# Patient Record
Sex: Female | Born: 1985 | Race: Black or African American | Hispanic: No | Marital: Single
Health system: Southern US, Community
[De-identification: ages and names within clinical notes are randomized; demographics above are authoritative.]

## PROBLEM LIST (undated history)

## (undated) ENCOUNTER — Inpatient Hospital Stay (HOSPITAL_COMMUNITY): Payer: Self-pay

## (undated) DIAGNOSIS — B9689 Other specified bacterial agents as the cause of diseases classified elsewhere: Secondary | ICD-10-CM

## (undated) DIAGNOSIS — Z789 Other specified health status: Secondary | ICD-10-CM

## (undated) DIAGNOSIS — IMO0002 Reserved for concepts with insufficient information to code with codable children: Secondary | ICD-10-CM

## (undated) DIAGNOSIS — R87619 Unspecified abnormal cytological findings in specimens from cervix uteri: Secondary | ICD-10-CM

## (undated) DIAGNOSIS — N760 Acute vaginitis: Secondary | ICD-10-CM

## (undated) HISTORY — PX: CERVIX LESION DESTRUCTION: SHX591

## (undated) HISTORY — PX: WISDOM TOOTH EXTRACTION: SHX21

---
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
## 2006-01-25 ENCOUNTER — Emergency Department (HOSPITAL_COMMUNITY): Admission: EM | Admit: 2006-01-25 | Discharge: 2006-01-25 | Payer: Self-pay | Admitting: Emergency Medicine

## 2006-08-22 ENCOUNTER — Emergency Department (HOSPITAL_COMMUNITY): Admission: EM | Admit: 2006-08-22 | Discharge: 2006-08-22 | Payer: Self-pay | Admitting: Emergency Medicine

## 2006-10-26 ENCOUNTER — Inpatient Hospital Stay (HOSPITAL_COMMUNITY): Admission: AD | Admit: 2006-10-26 | Discharge: 2006-10-27 | Payer: Self-pay | Admitting: Gynecology

## 2007-01-04 ENCOUNTER — Inpatient Hospital Stay (HOSPITAL_COMMUNITY): Admission: AD | Admit: 2007-01-04 | Discharge: 2007-01-04 | Payer: Self-pay | Admitting: Obstetrics

## 2007-04-30 ENCOUNTER — Inpatient Hospital Stay (HOSPITAL_COMMUNITY): Admission: AD | Admit: 2007-04-30 | Discharge: 2007-04-30 | Payer: Self-pay | Admitting: Obstetrics

## 2007-05-18 ENCOUNTER — Inpatient Hospital Stay (HOSPITAL_COMMUNITY): Admission: AD | Admit: 2007-05-18 | Discharge: 2007-05-18 | Payer: Self-pay | Admitting: Obstetrics

## 2007-06-02 ENCOUNTER — Inpatient Hospital Stay (HOSPITAL_COMMUNITY): Admission: AD | Admit: 2007-06-02 | Discharge: 2007-06-02 | Payer: Self-pay | Admitting: Obstetrics

## 2007-06-03 ENCOUNTER — Inpatient Hospital Stay (HOSPITAL_COMMUNITY): Admission: AD | Admit: 2007-06-03 | Discharge: 2007-06-05 | Payer: Self-pay | Admitting: Obstetrics

## 2007-06-16 ENCOUNTER — Inpatient Hospital Stay (HOSPITAL_COMMUNITY): Admission: AD | Admit: 2007-06-16 | Discharge: 2007-06-16 | Payer: Self-pay | Admitting: Obstetrics

## 2007-11-13 ENCOUNTER — Emergency Department (HOSPITAL_COMMUNITY): Admission: EM | Admit: 2007-11-13 | Discharge: 2007-11-14 | Payer: Self-pay | Admitting: Emergency Medicine

## 2008-03-28 ENCOUNTER — Emergency Department (HOSPITAL_COMMUNITY): Admission: EM | Admit: 2008-03-28 | Discharge: 2008-03-28 | Payer: Self-pay | Admitting: Emergency Medicine

## 2008-10-26 ENCOUNTER — Emergency Department (HOSPITAL_COMMUNITY): Admission: EM | Admit: 2008-10-26 | Discharge: 2008-10-26 | Payer: Self-pay | Admitting: Emergency Medicine

## 2009-10-02 ENCOUNTER — Emergency Department (HOSPITAL_COMMUNITY): Admission: EM | Admit: 2009-10-02 | Discharge: 2009-10-02 | Payer: Self-pay | Admitting: Emergency Medicine

## 2010-07-23 ENCOUNTER — Emergency Department (HOSPITAL_COMMUNITY): Admission: EM | Admit: 2010-07-23 | Discharge: 2010-07-23 | Payer: Self-pay | Admitting: Emergency Medicine

## 2010-09-09 ENCOUNTER — Emergency Department (HOSPITAL_COMMUNITY): Admission: EM | Admit: 2010-09-09 | Discharge: 2010-09-09 | Payer: Self-pay | Admitting: Emergency Medicine

## 2011-01-22 ENCOUNTER — Inpatient Hospital Stay (INDEPENDENT_AMBULATORY_CARE_PROVIDER_SITE_OTHER)
Admission: RE | Admit: 2011-01-22 | Discharge: 2011-01-22 | Disposition: A | Discharge status: Home / Self Care | Payer: Medicaid Other | Source: Ambulatory Visit | Attending: Family Medicine | Admitting: Family Medicine

## 2011-01-22 DIAGNOSIS — K5289 Other specified noninfective gastroenteritis and colitis: Secondary | ICD-10-CM

## 2011-01-27 LAB — POCT I-STAT, CHEM 8
BUN: 9 mg/dL (ref 6–23)
Calcium, Ion: 1.2 mmol/L (ref 1.12–1.32)
Chloride: 106 mEq/L (ref 96–112)
Creatinine, Ser: 0.7 mg/dL (ref 0.4–1.2)
Glucose, Bld: 94 mg/dL (ref 70–99)
HCT: 40 % (ref 36.0–46.0)
Hemoglobin: 13.6 g/dL (ref 12.0–15.0)
Potassium: 3.3 mEq/L — ABNORMAL LOW (ref 3.5–5.1)
Sodium: 145 mEq/L (ref 135–145)
TCO2: 26 mmol/L (ref 0–100)

## 2011-01-28 LAB — POCT URINALYSIS DIPSTICK
Glucose, UA: 100 mg/dL — AB
Ketones, ur: 40 mg/dL — AB
Nitrite: NEGATIVE
Protein, ur: 100 mg/dL — AB
Specific Gravity, Urine: >=1.03 (ref 1.005–1.030)
Urobilinogen, UA: 1 mg/dL (ref 0.0–1.0)
pH: 5.5 (ref 5.0–8.0)

## 2011-01-28 LAB — GC/CHLAMYDIA PROBE AMP, GENITAL
Chlamydia, DNA Probe: NEGATIVE
GC Probe Amp, Genital: NEGATIVE

## 2011-01-28 LAB — POCT PREGNANCY, URINE: Preg Test, Ur: NEGATIVE

## 2011-01-28 LAB — WET PREP, GENITAL
Clue Cells Wet Prep HPF POC: NONE SEEN
Yeast Wet Prep HPF POC: NONE SEEN

## 2011-02-17 LAB — GC/CHLAMYDIA PROBE AMP, GENITAL: Chlamydia, DNA Probe: NEGATIVE

## 2011-02-17 LAB — POCT URINALYSIS DIP (DEVICE)
Bilirubin Urine: NEGATIVE
Nitrite: NEGATIVE
Protein, ur: >=300 mg/dL — AB
Urobilinogen, UA: 0.2 mg/dL (ref 0.0–1.0)
pH: 5.5 (ref 5.0–8.0)

## 2011-02-17 LAB — WET PREP, GENITAL

## 2011-06-29 ENCOUNTER — Encounter (INDEPENDENT_AMBULATORY_CARE_PROVIDER_SITE_OTHER): Payer: Self-pay | Admitting: Surgery

## 2011-07-01 ENCOUNTER — Ambulatory Visit (INDEPENDENT_AMBULATORY_CARE_PROVIDER_SITE_OTHER): Payer: Medicaid Other | Admitting: Surgery

## 2011-07-02 ENCOUNTER — Emergency Department (HOSPITAL_COMMUNITY)
Admission: EM | Admit: 2011-07-02 | Discharge: 2011-07-02 | Disposition: A | Discharge status: Home / Self Care | Payer: Medicaid Other | Attending: Emergency Medicine | Admitting: Emergency Medicine

## 2011-07-02 DIAGNOSIS — M545 Low back pain, unspecified: Secondary | ICD-10-CM | POA: Insufficient documentation

## 2011-07-02 DIAGNOSIS — M2560 Stiffness of unspecified joint, not elsewhere classified: Secondary | ICD-10-CM | POA: Insufficient documentation

## 2011-07-02 DIAGNOSIS — S335XXA Sprain of ligaments of lumbar spine, initial encounter: Secondary | ICD-10-CM | POA: Insufficient documentation

## 2011-07-23 ENCOUNTER — Inpatient Hospital Stay (HOSPITAL_COMMUNITY)
Admission: AD | Admit: 2011-07-23 | Discharge: 2011-07-23 | Disposition: A | Discharge status: Home / Self Care | Payer: Medicaid Other | Source: Ambulatory Visit | Attending: Family Medicine | Admitting: Family Medicine

## 2011-07-23 DIAGNOSIS — T192XXA Foreign body in vulva and vagina, initial encounter: Secondary | ICD-10-CM | POA: Insufficient documentation
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
## 2011-07-26 ENCOUNTER — Telehealth (INDEPENDENT_AMBULATORY_CARE_PROVIDER_SITE_OTHER): Payer: Self-pay | Admitting: Surgery

## 2011-07-26 NOTE — Telephone Encounter (Signed)
Dr Ezzard Standing is not here at that time.  He has not seen the pt before so she can be rescheduled to see another md

## 2011-07-29 ENCOUNTER — Inpatient Hospital Stay (INDEPENDENT_AMBULATORY_CARE_PROVIDER_SITE_OTHER)
Admission: RE | Admit: 2011-07-29 | Discharge: 2011-07-29 | Disposition: A | Discharge status: Home / Self Care | Payer: No Typology Code available for payment source | Source: Ambulatory Visit | Attending: Family Medicine | Admitting: Family Medicine

## 2011-07-29 DIAGNOSIS — A64 Unspecified sexually transmitted disease: Secondary | ICD-10-CM

## 2011-07-29 LAB — POCT URINALYSIS DIP (DEVICE)
Glucose, UA: NEGATIVE mg/dL
Ketones, ur: NEGATIVE mg/dL
Leukocytes, UA: NEGATIVE
Protein, ur: NEGATIVE mg/dL

## 2011-07-29 LAB — POCT PREGNANCY, URINE: Preg Test, Ur: NEGATIVE

## 2011-07-29 LAB — WET PREP, GENITAL: Yeast Wet Prep HPF POC: NONE SEEN

## 2011-07-30 LAB — GC/CHLAMYDIA PROBE AMP, GENITAL
Chlamydia, DNA Probe: NEGATIVE
GC Probe Amp, Genital: NEGATIVE

## 2011-08-04 ENCOUNTER — Ambulatory Visit (INDEPENDENT_AMBULATORY_CARE_PROVIDER_SITE_OTHER): Payer: Medicaid Other | Admitting: Surgery

## 2011-08-20 LAB — POCT URINALYSIS DIP (DEVICE)
Bilirubin Urine: NEGATIVE
Glucose, UA: NEGATIVE mg/dL
Ketones, ur: NEGATIVE mg/dL
Nitrite: NEGATIVE

## 2011-08-20 LAB — STREP A DNA PROBE: Group A Strep Probe: NEGATIVE

## 2011-08-20 LAB — WET PREP, GENITAL: Trich, Wet Prep: NONE SEEN

## 2011-08-30 LAB — CBC
Hemoglobin: 13
Hemoglobin: 11.1 — ABNORMAL LOW
RBC: 3.85 — ABNORMAL LOW
RBC: 3.24 — ABNORMAL LOW
RBC: 3.5 — ABNORMAL LOW
RDW: 12.4
WBC: 9.4

## 2011-08-30 LAB — WET PREP, GENITAL
Clue Cells Wet Prep HPF POC: NONE SEEN
Trich, Wet Prep: NONE SEEN

## 2011-08-30 LAB — RPR: RPR Ser Ql: NONREACTIVE

## 2011-08-30 LAB — RAPID HIV SCREEN (WH-MAU): Rapid HIV Screen: NONREACTIVE

## 2011-09-01 LAB — URINALYSIS, ROUTINE W REFLEX MICROSCOPIC
Hgb urine dipstick: NEGATIVE
Specific Gravity, Urine: 1.02
Urobilinogen, UA: 0.2

## 2011-09-17 ENCOUNTER — Ambulatory Visit (INDEPENDENT_AMBULATORY_CARE_PROVIDER_SITE_OTHER): Payer: Medicaid Other | Admitting: Surgery

## 2011-10-02 ENCOUNTER — Emergency Department (HOSPITAL_COMMUNITY)
Admission: EM | Admit: 2011-10-02 | Discharge: 2011-10-02 | Disposition: A | Discharge status: Home / Self Care | Payer: Medicaid Other | Attending: Emergency Medicine | Admitting: Emergency Medicine

## 2011-10-02 ENCOUNTER — Emergency Department (HOSPITAL_COMMUNITY): Payer: Medicaid Other

## 2011-10-02 ENCOUNTER — Encounter (HOSPITAL_COMMUNITY): Payer: Self-pay | Admitting: *Deleted

## 2011-10-02 DIAGNOSIS — M25562 Pain in left knee: Secondary | ICD-10-CM

## 2011-10-02 DIAGNOSIS — M25439 Effusion, unspecified wrist: Secondary | ICD-10-CM | POA: Insufficient documentation

## 2011-10-02 DIAGNOSIS — M25532 Pain in left wrist: Secondary | ICD-10-CM

## 2011-10-02 DIAGNOSIS — M25539 Pain in unspecified wrist: Secondary | ICD-10-CM | POA: Insufficient documentation

## 2011-10-02 DIAGNOSIS — R51 Headache: Secondary | ICD-10-CM | POA: Insufficient documentation

## 2011-10-02 DIAGNOSIS — M79609 Pain in unspecified limb: Secondary | ICD-10-CM | POA: Insufficient documentation

## 2011-10-02 DIAGNOSIS — R21 Rash and other nonspecific skin eruption: Secondary | ICD-10-CM | POA: Insufficient documentation

## 2011-10-02 DIAGNOSIS — M25569 Pain in unspecified knee: Secondary | ICD-10-CM | POA: Insufficient documentation

## 2011-10-02 MED ORDER — DIAZEPAM 5 MG PO TABS: 5.0000 mg | ORAL_TABLET | Freq: Two times a day (BID) | ORAL | Status: AC

## 2011-10-02 MED ORDER — IBUPROFEN 800 MG PO TABS
800.0000 mg | ORAL_TABLET | Freq: Once | ORAL | Status: AC
  Administered 2011-10-02: 800 mg via ORAL
  Filled 2011-10-02: qty 1

## 2011-10-02 MED ORDER — IBUPROFEN 800 MG PO TABS: 800.0000 mg | ORAL_TABLET | Freq: Three times a day (TID) | ORAL | Status: AC
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
## 2011-10-02 NOTE — ED Notes (Signed)
Pt showing no sign of neuro deficits

## 2011-10-04 NOTE — ED Provider Notes (Signed)
Medical screening examination/treatment/procedure(s) were performed by non-physician practitioner and as supervising physician I was immediately available for consultation/collaboration.  Kacy Hegna L Filemon Breton, MD 10/04/11 1333 

## 2012-08-06 ENCOUNTER — Encounter (HOSPITAL_COMMUNITY): Payer: Self-pay | Admitting: *Deleted

## 2012-08-06 DIAGNOSIS — M545 Low back pain, unspecified: Secondary | ICD-10-CM | POA: Insufficient documentation
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
## 2012-08-07 ENCOUNTER — Emergency Department (HOSPITAL_COMMUNITY)
Admission: EM | Admit: 2012-08-07 | Discharge: 2012-08-07 | Disposition: A | Discharge status: Home / Self Care | Payer: Medicaid Other | Attending: Emergency Medicine | Admitting: Emergency Medicine

## 2012-08-07 DIAGNOSIS — M545 Low back pain: Secondary | ICD-10-CM

## 2012-08-07 MED ORDER — CYCLOBENZAPRINE HCL 10 MG PO TABS: 5.0000 mg | ORAL_TABLET | Freq: Two times a day (BID) | ORAL | Status: DC | PRN

## 2012-08-07 MED ORDER — IBUPROFEN 600 MG PO TABS: 600.0000 mg | ORAL_TABLET | Freq: Four times a day (QID) | ORAL | Status: DC | PRN

## 2012-08-07 NOTE — ED Provider Notes (Signed)
Medical screening examination/treatment/procedure(s) were performed by non-physician practitioner and as supervising physician I was immediately available for consultation/collaboration.  Hasson Gaspard M Javelle Donigan, MD 08/07/12 0218 
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
## 2012-10-02 ENCOUNTER — Inpatient Hospital Stay (HOSPITAL_COMMUNITY)
Admission: AD | Admit: 2012-10-02 | Discharge: 2012-10-02 | Disposition: A | Discharge status: Hospice | Payer: Medicaid Other | Source: Ambulatory Visit | Attending: Obstetrics & Gynecology | Admitting: Obstetrics & Gynecology

## 2012-10-02 ENCOUNTER — Encounter (HOSPITAL_COMMUNITY): Payer: Self-pay | Admitting: *Deleted

## 2012-10-02 DIAGNOSIS — N9489 Other specified conditions associated with female genital organs and menstrual cycle: Secondary | ICD-10-CM | POA: Insufficient documentation

## 2012-10-02 DIAGNOSIS — N949 Unspecified condition associated with female genital organs and menstrual cycle: Secondary | ICD-10-CM | POA: Insufficient documentation

## 2012-10-02 DIAGNOSIS — N898 Other specified noninflammatory disorders of vagina: Secondary | ICD-10-CM

## 2012-10-02 HISTORY — DX: Other specified health status: Z78.9

## 2012-10-02 HISTORY — DX: Reserved for concepts with insufficient information to code with codable children: IMO0002

## 2012-10-02 HISTORY — DX: Unspecified abnormal cytological findings in specimens from cervix uteri: R87.619

## 2012-10-02 LAB — URINALYSIS, ROUTINE W REFLEX MICROSCOPIC
Bilirubin Urine: NEGATIVE
Glucose, UA: NEGATIVE mg/dL
Ketones, ur: NEGATIVE mg/dL
Protein, ur: NEGATIVE mg/dL

## 2012-10-02 LAB — WET PREP, GENITAL
Trich, Wet Prep: NONE SEEN
Yeast Wet Prep HPF POC: NONE SEEN

## 2012-10-02 LAB — POCT PREGNANCY, URINE: Preg Test, Ur: NEGATIVE

## 2012-10-02 LAB — URINE MICROSCOPIC-ADD ON

## 2012-10-02 NOTE — MAU Provider Note (Signed)
History     CSN: [id number]  Arrival date and time: 10/02/12 2155   First Provider Initiated Contact with Patient 10/02/12 2225      Chief Complaint  Patient presents with  . Vaginal Pain   HPI Megan Nolan is a 26 y.o. female who presents to MAU with vaginal pain.  The pain started yesterday.  She describes the pain as sharp that comes and goes. When the pain comes it is 7/10. Current sex partner x 3 years. She is concerned that he may have given her an STI. There is vaginal discharge. There is a tender area near the urethra. Last pap smear less than one year ago at [facility] and was normal. Hx of Chlamydia years ago. The history was provided by the patient.   OB History    Grav Para Term Preterm Abortions TAB SAB Ect Mult Living   1 1 1       1       Past Medical History  Diagnosis Date  . No pertinent past medical history   . Abnormal Pap smear     Past Surgical History  Procedure Date  . Cervix lesion destruction   . Wisdom tooth extraction     Family History  Problem Relation Age of Onset  . Cancer Maternal Grandmother     History  Substance Use Topics  . Smoking status: Former Smoker    Types: Cigarettes    Quit date: 04/01/2012  . Smokeless tobacco: Not on file  . Alcohol Use: Yes     Comment: occasionally    Allergies: No Known Allergies  Prescriptions prior to admission  Medication Sig Dispense Refill  . Multiple Vitamins-Minerals (MULTIVITAMINS THER. W/MINERALS) TABS Take 1 tablet by mouth daily.        . cyclobenzaprine (FLEXERIL) 10 MG tablet Take 0.5 tablets (5 mg total) by mouth 2 (two) times daily as needed for muscle spasms.  20 tablet  0  . ibuprofen (ADVIL,MOTRIN) 600 MG tablet Take 1 tablet (600 mg total) by mouth every 6 (six) hours as needed for pain.  30 tablet  0    Review of Systems  Constitutional: Negative for fever and chills.  Eyes: Negative for blurred vision and double vision.  Respiratory: Negative for cough and  wheezing.   Cardiovascular: Negative for chest pain.  Gastrointestinal: Negative for heartburn, nausea, vomiting and abdominal pain.  Genitourinary: Negative for dysuria and frequency.       Vaginal discharge, vaginal pain  Musculoskeletal: Negative for back pain.  Neurological: Negative for dizziness, seizures and headaches.  Psychiatric/Behavioral: Negative for depression. The patient is not nervous/anxious.    Physical Exam   Blood pressure 136/81, pulse 78, temperature 97.9 F (36.6 C), temperature source Oral, resp. rate 18, height 5\' 6"  (1.676 m), weight 150 lb (68.04 kg), last menstrual period 09/13/2012, SpO2 100.00%.  Physical Exam  Nursing note and vitals reviewed. Constitutional: She is oriented to person, place, and time. She appears well-developed and well-nourished. No distress.  HENT:  Head: Normocephalic and atraumatic.  Eyes: EOM are normal.  Neck: Neck supple.  Cardiovascular: Normal rate.   Respiratory: Effort normal.  GI: Soft. There is no tenderness.  Genitourinary:       External genitalia with red raised tender area near the urethra. White creamy discharge vaginal vault. Mild CMT, no adnexal tenderness. Uterus without palpable enlargement.  Musculoskeletal: Normal range of motion.  Neurological: She is alert and oriented to person, place, and time.  Skin:  Skin is warm and dry.  Psychiatric: She has a normal mood and affect. Her behavior is normal. Judgment and thought content normal.   Results for orders placed during the hospital encounter of 10/02/12 (from the past 24 hour(s))  URINALYSIS, ROUTINE W REFLEX MICROSCOPIC     Status: Abnormal   Collection Time   10/02/12 10:05 PM      Component Value Range   Color, Urine YELLOW  YELLOW   APPearance CLEAR  CLEAR   Specific Gravity, Urine 1.025  1.005 - 1.030   pH 6.0  5.0 - 8.0   Glucose, UA NEGATIVE  NEGATIVE mg/dL   Hgb urine dipstick TRACE (*) NEGATIVE   Bilirubin Urine NEGATIVE  NEGATIVE   Ketones,  ur NEGATIVE  NEGATIVE mg/dL   Protein, ur NEGATIVE  NEGATIVE mg/dL   Urobilinogen, UA 1.0  0.0 - 1.0 mg/dL   Nitrite NEGATIVE  NEGATIVE   Leukocytes, UA NEGATIVE  NEGATIVE  URINE MICROSCOPIC-ADD ON     Status: Normal   Collection Time   10/02/12 10:05 PM      Component Value Range   Squamous Epithelial / LPF RARE  RARE   WBC, UA 0-2  <3 WBC/hpf   Bacteria, UA RARE  RARE  POCT PREGNANCY, URINE     Status: Normal   Collection Time   10/02/12 10:18 PM      Component Value Range   Preg Test, Ur NEGATIVE  NEGATIVE  WET PREP, GENITAL     Status: Abnormal   Collection Time   10/02/12 10:30 PM      Component Value Range   Yeast Wet Prep HPF POC NONE SEEN  NONE SEEN   Trich, Wet Prep NONE SEEN  NONE SEEN   Clue Cells Wet Prep HPF POC FEW (*) NONE SEEN   WBC, Wet Prep HPF POC FEW (*) NONE SEEN   MAU Course  Procedures Assessment: 26 y.o. female with vaginal pain   Genital lesion   Vaginal discharge  Plan:  STI screening GC, Chlamydia, HSV   Discussed with patient   Ibuprofen as needed for pain.   Follow up with [facility].  Discussed with the patient and all questioned fully answered. She will return if any problems arise.   Medication List     As of 10/03/2012  7:22 AM    CONTINUE taking these medications         cyclobenzaprine 10 MG tablet   Commonly known as: FLEXERIL   Take 0.5 tablets (5 mg total) by mouth 2 (two) times daily as needed for muscle spasms.      ibuprofen 600 MG tablet   Commonly known as: ADVIL,MOTRIN   Take 1 tablet (600 mg total) by mouth every 6 (six) hours as needed for pain.      multivitamins ther. w/minerals Tabs            Megan Nolan 10/02/2012, 10:57 PM

## 2012-10-02 NOTE — MAU Note (Signed)
Tonight she noticed a bump on her left labia, no burning when she voids, and no birth control either

## 2012-10-02 NOTE — MAU Note (Signed)
Pt reports "i am having shocking pains in my vagina", states she has a rash and itching. LMP 09/13/2012

## 2012-10-04 LAB — HERPES SIMPLEX VIRUS CULTURE

## 2012-10-04 NOTE — MAU Provider Note (Signed)
Attestation of Attending Supervision of Advanced Practitioner (CNM/NP): Evaluation and management procedures were performed by the Advanced Practitioner under my supervision and collaboration.  I have reviewed the Advanced Practitioner's note and chart, and I agree with the management and plan.  HARRAWAY-SMITH, Emmer Lillibridge 12:17 PM     

## 2013-02-20 ENCOUNTER — Encounter (HOSPITAL_COMMUNITY): Payer: Self-pay | Admitting: *Deleted

## 2013-02-20 ENCOUNTER — Emergency Department (HOSPITAL_COMMUNITY)
Admission: EM | Admit: 2013-02-20 | Discharge: 2013-02-20 | Disposition: A | Discharge status: Home / Self Care | Payer: Medicaid Other | Attending: Emergency Medicine | Admitting: Emergency Medicine

## 2013-02-20 DIAGNOSIS — R109 Unspecified abdominal pain: Secondary | ICD-10-CM | POA: Insufficient documentation

## 2013-02-20 DIAGNOSIS — N390 Urinary tract infection, site not specified: Secondary | ICD-10-CM | POA: Insufficient documentation

## 2013-02-20 DIAGNOSIS — R3915 Urgency of urination: Secondary | ICD-10-CM | POA: Insufficient documentation

## 2013-02-20 DIAGNOSIS — R350 Frequency of micturition: Secondary | ICD-10-CM | POA: Insufficient documentation

## 2013-02-20 DIAGNOSIS — Z3202 Encounter for pregnancy test, result negative: Secondary | ICD-10-CM | POA: Insufficient documentation

## 2013-02-20 DIAGNOSIS — Z87891 Personal history of nicotine dependence: Secondary | ICD-10-CM | POA: Insufficient documentation

## 2013-02-20 DIAGNOSIS — Z8742 Personal history of other diseases of the female genital tract: Secondary | ICD-10-CM | POA: Insufficient documentation

## 2013-02-20 LAB — URINALYSIS, ROUTINE W REFLEX MICROSCOPIC
Glucose, UA: NEGATIVE mg/dL
Protein, ur: 100 mg/dL — AB
Specific Gravity, Urine: 1.024 (ref 1.005–1.030)

## 2013-02-20 LAB — URINE MICROSCOPIC-ADD ON

## 2013-02-20 LAB — POCT PREGNANCY, URINE: Preg Test, Ur: NEGATIVE

## 2013-02-20 MED ORDER — NITROFURANTOIN MONOHYD MACRO 100 MG PO CAPS
100.0000 mg | ORAL_CAPSULE | Freq: Once | ORAL | Status: AC
  Administered 2013-02-20: 100 mg via ORAL
  Filled 2013-02-20: qty 1

## 2013-02-20 MED ORDER — NITROFURANTOIN MONOHYD MACRO 100 MG PO CAPS: 100.0000 mg | ORAL_CAPSULE | Freq: Two times a day (BID) | ORAL | Status: DC

## 2013-02-20 MED ORDER — PHENAZOPYRIDINE HCL 200 MG PO TABS: 200.0000 mg | ORAL_TABLET | Freq: Three times a day (TID) | ORAL | Status: DC | PRN

## 2013-02-20 MED ORDER — PHENAZOPYRIDINE HCL 100 MG PO TABS
200.0000 mg | ORAL_TABLET | Freq: Once | ORAL | Status: AC
  Administered 2013-02-20: 200 mg via ORAL
  Filled 2013-02-20: qty 2

## 2013-02-20 NOTE — ED Notes (Signed)
Pt c/o pain with urination and urinary frequency x 2 days, also concerned for STD.  Denies n/v, fever/chills.

## 2013-02-20 NOTE — ED Provider Notes (Signed)
History     CSN: [id number]  Arrival date & time 02/20/13  0130   First MD Initiated Contact with Patient 02/20/13 0405      Chief Complaint  Patient presents with  . Dysuria   HPI  History provided by the patient. Patient is a 27 year old female with no significant PMH who presents with complaints of dysuria, urinary frequency for the past 2 days. Symptoms have progressively worsened. They're described as a burning sensation with urination. She also has urgency and frequency. Symptoms have not been associated with any other symptoms. No fever, chills or sweats. No nausea or vomiting. No flank pain. No visual change. No vaginal bleeding or discharge. Patient is sexually active the same partner for the past 2 years. She does report history of STD many years ago as a teenager. She has not had similar symptoms of vaginal discharge previously. No other aggravating or alleviating factors. No other associated symptoms.    Past Medical History  Diagnosis Date  . No pertinent past medical history   . Abnormal Pap smear     Past Surgical History  Procedure Laterality Date  . Cervix lesion destruction    . Wisdom tooth extraction      Family History  Problem Relation Age of Onset  . Cancer Maternal Grandmother     History  Substance Use Topics  . Smoking status: Former Smoker    Types: Cigarettes    Quit date: 04/01/2012  . Smokeless tobacco: Not on file  . Alcohol Use: Yes     Comment: occasionally    OB History   Grav Para Term Preterm Abortions TAB SAB Ect Mult Living   1 1 1       1       Review of Systems  Constitutional: Negative for fever and chills.  Genitourinary: Positive for dysuria and frequency. Negative for hematuria and menstrual problem.  All other systems reviewed and are negative.    Allergies  Review of patient's allergies indicates no known allergies.  Home Medications  No current outpatient prescriptions on file.  BP 124/74  Pulse 70   Temp(Src) 97.8 F (36.6 C) (Oral)  Resp 18  SpO2 94%  LMP 02/05/2013  Physical Exam  Nursing note and vitals reviewed. Constitutional: She is oriented to person, place, and time. She appears well-developed and well-nourished. No distress.  HENT:  Head: Normocephalic.  Cardiovascular: Normal rate and regular rhythm.   Pulmonary/Chest: Effort normal and breath sounds normal.  Abdominal: Soft. There is tenderness in the suprapubic area. There is no rebound, no guarding, no CVA tenderness, no tenderness at McBurney's point and negative Murphy's sign.  Musculoskeletal: Normal range of motion.  Neurological: She is alert and oriented to person, place, and time.  Skin: Skin is warm and dry. No rash noted.  Psychiatric: She has a normal mood and affect. Her behavior is normal.    ED Course  Procedures   Results for orders placed during the hospital encounter of 02/20/13  URINALYSIS, ROUTINE W REFLEX MICROSCOPIC      Result Value Range   Color, Urine YELLOW  YELLOW   APPearance TURBID (*) CLEAR   Specific Gravity, Urine 1.024  1.005 - 1.030   pH 6.0  5.0 - 8.0   Glucose, UA NEGATIVE  NEGATIVE mg/dL   Hgb urine dipstick MODERATE (*) NEGATIVE   Bilirubin Urine NEGATIVE  NEGATIVE   Ketones, ur NEGATIVE  NEGATIVE mg/dL   Protein, ur 409 (*) NEGATIVE mg/dL   Urobilinogen,  UA 1.0  0.0 - 1.0 mg/dL   Nitrite POSITIVE (*) NEGATIVE   Leukocytes, UA LARGE (*) NEGATIVE  URINE MICROSCOPIC-ADD ON      Result Value Range   Squamous Epithelial / LPF FEW (*) RARE   WBC, UA TOO NUMEROUS TO COUNT  <3 WBC/hpf   RBC / HPF 3-6  <3 RBC/hpf   Bacteria, UA MANY (*) RARE  POCT PREGNANCY, URINE      Result Value Range   Preg Test, Ur NEGATIVE  NEGATIVE        1. UTI (lower urinary tract infection)       MDM  4:25AM patient seen and evaluated. Patient well-appearing in no acute distress. She does not appear severely ill or toxic.  Patient denies having any vaginal bleeding or discharge. We  discussed options for pelvic examination screened for STDs however patient no longer wishes to wait to have this performed. She prefers to try treatment for urinary tract infection to see if symptoms improve. She has been [facility] if she wishes to have further testing. She is also advised may return at anytime if she wishes to have further evaluations.      Angus Seller, PA-C 02/20/13 2203

## 2013-02-21 LAB — URINE CULTURE: Colony Count: >=100000

## 2013-02-21 NOTE — ED Provider Notes (Signed)
Medical screening examination/treatment/procedure(s) were performed by non-physician practitioner and as supervising physician I was immediately available for consultation/collaboration.  Alaija Ruble, MD 02/21/13 0628 

## 2013-02-22 ENCOUNTER — Telehealth (HOSPITAL_COMMUNITY): Payer: Self-pay | Admitting: Emergency Medicine

## 2013-04-19 ENCOUNTER — Encounter (HOSPITAL_COMMUNITY): Payer: Self-pay

## 2013-04-19 ENCOUNTER — Inpatient Hospital Stay (HOSPITAL_COMMUNITY)
Admission: AD | Admit: 2013-04-19 | Discharge: 2013-04-19 | Disposition: A | Discharge status: Home / Self Care | Payer: Medicaid Other | Source: Ambulatory Visit | Attending: Obstetrics & Gynecology | Admitting: Obstetrics & Gynecology

## 2013-04-19 DIAGNOSIS — N760 Acute vaginitis: Secondary | ICD-10-CM

## 2013-04-19 DIAGNOSIS — B9689 Other specified bacterial agents as the cause of diseases classified elsewhere: Secondary | ICD-10-CM

## 2013-04-19 DIAGNOSIS — O2390 Unspecified genitourinary tract infection in pregnancy, unspecified trimester: Secondary | ICD-10-CM | POA: Insufficient documentation

## 2013-04-19 DIAGNOSIS — A499 Bacterial infection, unspecified: Secondary | ICD-10-CM

## 2013-04-19 DIAGNOSIS — N949 Unspecified condition associated with female genital organs and menstrual cycle: Secondary | ICD-10-CM | POA: Insufficient documentation

## 2013-04-19 LAB — URINALYSIS, ROUTINE W REFLEX MICROSCOPIC
Bilirubin Urine: NEGATIVE
Ketones, ur: NEGATIVE mg/dL
Nitrite: NEGATIVE
Specific Gravity, Urine: 1.015 (ref 1.005–1.030)
Urobilinogen, UA: 0.2 mg/dL (ref 0.0–1.0)

## 2013-04-19 LAB — URINE MICROSCOPIC-ADD ON

## 2013-04-19 LAB — POCT PREGNANCY, URINE: Preg Test, Ur: POSITIVE — AB

## 2013-04-19 LAB — WET PREP, GENITAL

## 2013-04-19 MED ORDER — METRONIDAZOLE 500 MG PO TABS: 500.0000 mg | ORAL_TABLET | Freq: Two times a day (BID) | ORAL | Status: DC

## 2013-04-19 MED ORDER — ONDANSETRON 8 MG PO TBDP
8.0000 mg | ORAL_TABLET | Freq: Once | ORAL | Status: AC
  Administered 2013-04-19: 8 mg via ORAL
  Filled 2013-04-19: qty 1

## 2013-04-19 MED ORDER — ONDANSETRON HCL 4 MG PO TABS: 4.0000 mg | ORAL_TABLET | Freq: Three times a day (TID) | ORAL | Status: DC | PRN

## 2013-04-19 NOTE — MAU Note (Signed)
Patient states she has had a positive home pregnancy test but recently had a Depo injection. Has not been feeling well, nausea and no appetite. Has a vaginal discharge with odor.

## 2013-04-19 NOTE — Discharge Instructions (Signed)
Bacterial Vaginosis Bacterial vaginosis (BV) is a vaginal infection where the normal balance of bacteria in the vagina is disrupted. The normal balance is then replaced by an overgrowth of certain bacteria. There are several different kinds of bacteria that can cause BV. BV is the most common vaginal infection in women of childbearing age. CAUSES   The cause of BV is not fully understood. BV develops when there is an increase or imbalance of harmful bacteria.  Some activities or behaviors can upset the normal balance of bacteria in the vagina and put women at increased risk including:  Having a new sex partner or multiple sex partners.  Douching.  Using an intrauterine device (IUD) for contraception.  It is not clear what role sexual activity plays in the development of BV. However, women that have never had sexual intercourse are rarely infected with BV. Women do not get BV from toilet seats, bedding, swimming pools or from touching objects around them.  SYMPTOMS   Grey vaginal discharge.  A fish-like odor with discharge, especially after sexual intercourse.  Itching or burning of the vagina and vulva.  Burning or pain with urination.  Some women have no signs or symptoms at all. DIAGNOSIS  Your caregiver must examine the vagina for signs of BV. Your caregiver will perform lab tests and look at the sample of vaginal fluid through a microscope. They will look for bacteria and abnormal cells (clue cells), a pH test higher than 4.5, and a positive amine test all associated with BV.  RISKS AND COMPLICATIONS   Pelvic inflammatory disease (PID).  Infections following gynecology surgery.  Developing HIV.  Developing herpes virus. TREATMENT  Sometimes BV will clear up without treatment. However, all women with symptoms of BV should be treated to avoid complications, especially if gynecology surgery is planned. Female partners generally do not need to be treated. However, BV may spread  between female sex partners so treatment is helpful in preventing a recurrence of BV.   BV may be treated with antibiotics. The antibiotics come in either pill or vaginal cream forms. Either can be used with nonpregnant or pregnant women, but the recommended dosages differ. These antibiotics are not harmful to the baby.  BV can recur after treatment. If this happens, a second round of antibiotics will often be prescribed.  Treatment is important for pregnant women. If not treated, BV can cause a premature delivery, especially for a pregnant woman who had a premature birth in the past. All pregnant women who have symptoms of BV should be checked and treated.  For chronic reoccurrence of BV, treatment with a type of prescribed gel vaginally twice a week is helpful. HOME CARE INSTRUCTIONS   Finish all medication as directed by your caregiver.  Do not have sex until treatment is completed.  Tell your sexual partner that you have a vaginal infection. They should see their caregiver and be treated if they have problems, such as a mild rash or itching.  Practice safe sex. Use condoms. Only have 1 sex partner. PREVENTION  Basic prevention steps can help reduce the risk of upsetting the natural balance of bacteria in the vagina and developing BV:  Do not have sexual intercourse (be abstinent).  Do not douche.  Use all of the medicine prescribed for treatment of BV, even if the signs and symptoms go away.  Tell your sex partner if you have BV. That way, they can be treated, if needed, to prevent reoccurrence. SEEK MEDICAL CARE IF:     Your symptoms are not improving after 3 days of treatment.  You have increased discharge, pain, or fever. MAKE SURE YOU:   Understand these instructions.  Will watch your condition.  Will get help right away if you are not doing well or get worse. FOR MORE INFORMATION  Division of STD Prevention (DSTDP), Centers for Disease Control and Prevention:  www.cdc.gov/std American [employer] Health Association (ASHA): www.ashastd.org  Document Released: 11/01/2005 Document Revised: 01/24/2012 Document Reviewed: 04/24/2009 Exit[employer] Patient Information 2014 [employer].  

## 2013-04-19 NOTE — MAU Provider Note (Signed)
History     CSN: [id number]  Arrival date and time: 04/19/13 1843   First Provider Initiated Contact with Patient 04/19/13 1943      Chief Complaint  Patient presents with  . Possible Pregnancy  . Nausea  . Vaginal Discharge   HPI [name] is a 27 y.o. G2P1001 at [redacted]w[redacted]d who presents to MAU for vaginal discharge, nausea and +HPT. The patient states LMP of 03/09/13. Had Depo provera at the [facility] on 03/22/13. Has not had any bleeding since. States frequent nausea without vomiting, dizziness, discharge. The discharge is thin and white with an odor. She denies fever, vaginal bleeding, abdominal pain or UTI symptoms.   OB History   Grav Para Term Preterm Abortions TAB SAB Ect Mult Living   2 1 1       1       Past Medical History  Diagnosis Date  . No pertinent past medical history   . Abnormal Pap smear     Past Surgical History  Procedure Laterality Date  . Cervix lesion destruction    . Wisdom tooth extraction      Family History  Problem Relation Age of Onset  . Cancer Maternal Grandmother     History  Substance Use Topics  . Smoking status: Former Smoker    Types: Cigarettes    Quit date: 04/01/2012  . Smokeless tobacco: Not on file  . Alcohol Use: Yes     Comment: occasionally    Allergies: No Known Allergies  Prescriptions prior to admission  Medication Sig Dispense Refill  . nitrofurantoin, macrocrystal-monohydrate, (MACROBID) 100 MG capsule Take 1 capsule (100 mg total) by mouth 2 (two) times daily. X 7 days  14 capsule  0  . phenazopyridine (PYRIDIUM) 200 MG tablet Take 1 tablet (200 mg total) by mouth 3 (three) times daily as needed for pain.  12 tablet  0    Review of Systems  Constitutional: Negative for fever and malaise/fatigue.  Gastrointestinal: Positive for nausea and constipation. Negative for vomiting, abdominal pain and diarrhea.  Genitourinary: Negative for dysuria, urgency and frequency.       Neg - vaginal bleeding + vaginal  discharge  Neurological: Positive for dizziness. Negative for loss of consciousness.   Physical Exam   Blood pressure 121/68, pulse 92, temperature 98.2 F (36.8 C), temperature source Oral, resp. rate 18, height 5' 6.5" (1.689 m), weight 151 lb 12.8 oz (68.856 kg), last menstrual period 03/09/2013.  Physical Exam  Constitutional: She is oriented to person, place, and time. She appears well-developed and well-nourished. No distress.  HENT:  Head: Normocephalic and atraumatic.  Cardiovascular: Normal rate, regular rhythm and normal heart sounds.   Respiratory: Effort normal and breath sounds normal. No respiratory distress.  GI: Soft. Bowel sounds are normal. She exhibits no distension and no mass. There is no tenderness. There is no rebound and no guarding.  Genitourinary: Uterus is enlarged (appropriate for GA). Uterus is not tender. Cervix exhibits no motion tenderness, no discharge and no friability. Right adnexum displays no mass and no tenderness. Left adnexum displays no mass and no tenderness. No bleeding around the vagina. Vaginal discharge (moderate amount of frothy, white discharge noted) found.  Neurological: She is alert and oriented to person, place, and time.  Skin: Skin is warm. No erythema.  Psychiatric: She has a normal mood and affect.   Results for orders placed during the hospital encounter of 04/19/13 (from the past 24 hour(s))  URINALYSIS, ROUTINE W REFLEX  MICROSCOPIC     Status: Abnormal   Collection Time    04/19/13  7:00 PM      Result Value Range   Color, Urine YELLOW  YELLOW   APPearance CLEAR  CLEAR   Specific Gravity, Urine 1.015  1.005 - 1.030   pH 6.0  5.0 - 8.0   Glucose, UA NEGATIVE  NEGATIVE mg/dL   Hgb urine dipstick TRACE (*) NEGATIVE   Bilirubin Urine NEGATIVE  NEGATIVE   Ketones, ur NEGATIVE  NEGATIVE mg/dL   Protein, ur NEGATIVE  NEGATIVE mg/dL   Urobilinogen, UA 0.2  0.0 - 1.0 mg/dL   Nitrite NEGATIVE  NEGATIVE   Leukocytes, UA TRACE (*)  NEGATIVE  URINE MICROSCOPIC-ADD ON     Status: None   Collection Time    04/19/13  7:00 PM      Result Value Range   Squamous Epithelial / LPF RARE  RARE   WBC, UA 0-2  <3 WBC/hpf   RBC / HPF 0-2  <3 RBC/hpf   Bacteria, UA RARE  RARE  POCT PREGNANCY, URINE     Status: Abnormal   Collection Time    04/19/13  7:05 PM      Result Value Range   Preg Test, Ur POSITIVE (*) NEGATIVE    MAU Course  Procedures None  MDM UA, UPT, Wet prep, GC/Chlamydia today ODT Zofran given in MAU   Results for orders placed during the hospital encounter of 04/19/13 (from the past 24 hour(s))  URINALYSIS, ROUTINE W REFLEX MICROSCOPIC     Status: Abnormal   Collection Time    04/19/13  7:00 PM      Result Value Range   Color, Urine YELLOW  YELLOW   APPearance CLEAR  CLEAR   Specific Gravity, Urine 1.015  1.005 - 1.030   pH 6.0  5.0 - 8.0   Glucose, UA NEGATIVE  NEGATIVE mg/dL   Hgb urine dipstick TRACE (*) NEGATIVE   Bilirubin Urine NEGATIVE  NEGATIVE   Ketones, ur NEGATIVE  NEGATIVE mg/dL   Protein, ur NEGATIVE  NEGATIVE mg/dL   Urobilinogen, UA 0.2  0.0 - 1.0 mg/dL   Nitrite NEGATIVE  NEGATIVE   Leukocytes, UA TRACE (*) NEGATIVE  URINE MICROSCOPIC-ADD ON     Status: None   Collection Time    04/19/13  7:00 PM      Result Value Range   Squamous Epithelial / LPF RARE  RARE   WBC, UA 0-2  <3 WBC/hpf   RBC / HPF 0-2  <3 RBC/hpf   Bacteria, UA RARE  RARE  POCT PREGNANCY, URINE     Status: Abnormal   Collection Time    04/19/13  7:05 PM      Result Value Range   Preg Test, Ur POSITIVE (*) NEGATIVE  WET PREP, GENITAL     Status: Abnormal   Collection Time    04/19/13  8:00 PM      Result Value Range   Yeast Wet Prep HPF POC NONE SEEN  NONE SEEN   Trich, Wet Prep NONE SEEN  NONE SEEN   Clue Cells Wet Prep HPF POC FEW (*) NONE SEEN   WBC, Wet Prep HPF POC FEW (*) NONE SEEN     Assessment and Plan  2000 - Results pending. Care turned over to [facility], CNM  Freddi Starr, PA-C   04/19/2013, 7:43 PM   1. BV (bacterial vaginosis)    RX: Flagyl 500mg  BID X 7 Zofran 4mg   PO #30  Start [facility] or plan termination as soon as possible.

## 2013-04-19 NOTE — MAU Note (Addendum)
Pt stated she had a positive home pregnancy test today but recently had Depo around the beginning of May. Pt states her LMP was near the end of April around the 25th. Pt states she recently has not been feeling well, experiencing nausea, dry heaves, and foul-smelling discharge (pt stated having intercourse sometime last wk). Pt denies any pain or vaginal bleeding

## 2013-04-20 LAB — GC/CHLAMYDIA PROBE AMP
CT Probe RNA: NEGATIVE
GC Probe RNA: NEGATIVE

## 2013-04-30 ENCOUNTER — Encounter (HOSPITAL_COMMUNITY): Payer: Self-pay | Admitting: *Deleted

## 2013-04-30 ENCOUNTER — Inpatient Hospital Stay (HOSPITAL_COMMUNITY)
Admission: AD | Admit: 2013-04-30 | Discharge: 2013-04-30 | Disposition: A | Discharge status: Home / Self Care | Payer: Medicaid Other | Source: Ambulatory Visit | Attending: Obstetrics & Gynecology | Admitting: Obstetrics & Gynecology

## 2013-04-30 DIAGNOSIS — O2390 Unspecified genitourinary tract infection in pregnancy, unspecified trimester: Secondary | ICD-10-CM | POA: Insufficient documentation

## 2013-04-30 DIAGNOSIS — N760 Acute vaginitis: Secondary | ICD-10-CM | POA: Insufficient documentation

## 2013-04-30 DIAGNOSIS — O219 Vomiting of pregnancy, unspecified: Secondary | ICD-10-CM

## 2013-04-30 DIAGNOSIS — O210 Mild hyperemesis gravidarum: Secondary | ICD-10-CM | POA: Insufficient documentation

## 2013-04-30 DIAGNOSIS — B9689 Other specified bacterial agents as the cause of diseases classified elsewhere: Secondary | ICD-10-CM | POA: Insufficient documentation

## 2013-04-30 DIAGNOSIS — A499 Bacterial infection, unspecified: Secondary | ICD-10-CM

## 2013-04-30 DIAGNOSIS — N949 Unspecified condition associated with female genital organs and menstrual cycle: Secondary | ICD-10-CM | POA: Insufficient documentation

## 2013-04-30 HISTORY — DX: Other specified bacterial agents as the cause of diseases classified elsewhere: N76.0

## 2013-04-30 HISTORY — DX: Acute vaginitis: B96.89

## 2013-04-30 LAB — URINALYSIS, ROUTINE W REFLEX MICROSCOPIC
Leukocytes, UA: NEGATIVE
Protein, ur: NEGATIVE mg/dL
Urobilinogen, UA: 0.2 mg/dL (ref 0.0–1.0)

## 2013-04-30 MED ORDER — DOXYLAMINE-PYRIDOXINE 10-10 MG PO TBEC: 2.0000 | DELAYED_RELEASE_TABLET | Freq: Every day | ORAL | Status: DC

## 2013-04-30 MED ORDER — METRONIDAZOLE 0.75 % VA GEL: 1.0000 | Freq: Two times a day (BID) | VAGINAL | Status: DC

## 2013-04-30 MED ORDER — PRENATAL COMPLETE 14-0.4 MG PO TABS: 1.0000 | ORAL_TABLET | Freq: Every day | ORAL | Status: DC

## 2013-04-30 NOTE — MAU Note (Signed)
Was being treated for BV but did not finish her rx;

## 2013-04-30 NOTE — MAU Provider Note (Signed)
Attestation of Attending Supervision of Advanced Practitioner (CNM/NP): Evaluation and management procedures were performed by the Advanced Practitioner under my supervision and collaboration.  I have reviewed the Advanced Practitioner's note and chart, and I agree with the management and plan.  HARRAWAY-SMITH, Lorelei Heikkila 6:01 PM     

## 2013-04-30 NOTE — MAU Note (Signed)
Wanting to know how far along she is since she conceived on depo.  Also was dx with BV, has been throwing up a lot and knows she has not really been treated.

## 2013-04-30 NOTE — MAU Provider Note (Signed)
History     CSN: [id number]  Arrival date and time: 04/30/13 1311   First Provider Initiated Contact with Patient 04/30/13 1432      Chief Complaint  Patient presents with  . see note, wants to know gest age    HPI [name] is a 27 y.o. G2P1001 at [redacted]w[redacted]d who presents to MAU today with complaint of N/V and vaginal discharge. The patient states that she was taking Zofran for nausea and it would work, but not for more than 1-2 hours. She was taking it more frequently than prescribed and called here to see if that was ok and states that a nurse told her not to take it anymore. The patient denies diarrhea, but is having constipation. Last BM was yesterday. She denies abdominal pain, vaginal bleeding or fever. She continues to have the same discharge that was recently evaluated here and diagnosed as BV. Patient states that Flagyl is making her throw up so she doesn't feel that she has been treated adequately. The patient is unsure if she wants to keep this pregnancy, but wants a pregnancy letter since she lost the last one given in MAU.   OB History   Grav Para Term Preterm Abortions TAB SAB Ect Mult Living   2 1 1       1       Past Medical History  Diagnosis Date  . No pertinent past medical history   . Abnormal Pap smear   . Bacterial vaginosis     Past Surgical History  Procedure Laterality Date  . Cervix lesion destruction    . Wisdom tooth extraction      Family History  Problem Relation Age of Onset  . Cancer Maternal Grandmother     History  Substance Use Topics  . Smoking status: Former Smoker    Types: Cigarettes    Quit date: 04/01/2012  . Smokeless tobacco: Not on file  . Alcohol Use: Yes     Comment: occasionally    Allergies: No Known Allergies  No prescriptions prior to admission    Review of Systems  Constitutional: Negative for fever and malaise/fatigue.  Gastrointestinal: Positive for nausea, vomiting and constipation. Negative for  abdominal pain and diarrhea.  Genitourinary: Negative for dysuria, urgency and frequency.       Neg - vaginal bleeding +discharge  Neurological: Negative for dizziness.   Physical Exam   Blood pressure 133/74, pulse 93, temperature 98.4 F (36.9 C), temperature source Oral, resp. rate 16, last menstrual period 03/09/2013.  Physical Exam  Constitutional: She is oriented to person, place, and time. She appears well-developed and well-nourished. No distress.  HENT:  Head: Normocephalic and atraumatic.  Cardiovascular: Normal rate, regular rhythm and normal heart sounds.   Respiratory: Effort normal and breath sounds normal. No respiratory distress.  GI: Soft. Bowel sounds are normal. She exhibits no distension and no mass. There is no tenderness. There is no rebound and no guarding.  Neurological: She is alert and oriented to person, place, and time.  Skin: Skin is warm and dry. No erythema.  Psychiatric: She has a normal mood and affect.   Results for orders placed during the hospital encounter of 04/30/13 (from the past 24 hour(s))  URINALYSIS, ROUTINE W REFLEX MICROSCOPIC     Status: Abnormal   Collection Time    04/30/13  2:30 PM      Result Value Range   Color, Urine YELLOW  YELLOW   APPearance HAZY (*) CLEAR  Specific Gravity, Urine 1.015  1.005 - 1.030   pH 6.5  5.0 - 8.0   Glucose, UA NEGATIVE  NEGATIVE mg/dL   Hgb urine dipstick NEGATIVE  NEGATIVE   Bilirubin Urine NEGATIVE  NEGATIVE   Ketones, ur NEGATIVE  NEGATIVE mg/dL   Protein, ur NEGATIVE  NEGATIVE mg/dL   Urobilinogen, UA 0.2  0.0 - 1.0 mg/dL   Nitrite NEGATIVE  NEGATIVE   Leukocytes, UA NEGATIVE  NEGATIVE     MAU Course  Procedures None  MDM UA today No signs of dehydration  Assessment and Plan  A: Nausea and vomiting in pregnancy prior to [redacted] weeks gestation Bacterial vaginosis  P: Discharge home Rx for Prenatal vitamins, Diclegis and metrogel Patient advised to start prenatal care with OB  provider of choice or decide about termination as soon as possible Pregnancy confirmation letter given again as patient states that she lost the previous letter Patient advised to stop using the Zofran and try OTC stool softener for constipation symptoms Patient may return to MAU as needed or if her condition were to change or worsen  Freddi Starr, PA-C  04/30/2013, 5:53 PM

## 2013-05-30 ENCOUNTER — Encounter: Payer: Self-pay | Admitting: Obstetrics

## 2013-05-30 ENCOUNTER — Encounter (HOSPITAL_COMMUNITY): Payer: Self-pay | Admitting: Gynecology

## 2013-05-30 ENCOUNTER — Ambulatory Visit (INDEPENDENT_AMBULATORY_CARE_PROVIDER_SITE_OTHER): Payer: Medicaid Other | Admitting: Obstetrics

## 2013-05-30 VITALS — BP 119/74 | Temp 98.2°F | Wt 144.8 lb

## 2013-05-30 DIAGNOSIS — Z3687 Encounter for antenatal screening for uncertain dates: Secondary | ICD-10-CM

## 2013-05-30 DIAGNOSIS — Z1389 Encounter for screening for other disorder: Secondary | ICD-10-CM

## 2013-05-30 DIAGNOSIS — N760 Acute vaginitis: Secondary | ICD-10-CM

## 2013-05-30 DIAGNOSIS — Z3480 Encounter for supervision of other normal pregnancy, unspecified trimester: Secondary | ICD-10-CM

## 2013-05-30 DIAGNOSIS — Z113 Encounter for screening for infections with a predominantly sexual mode of transmission: Secondary | ICD-10-CM

## 2013-05-30 DIAGNOSIS — Z3481 Encounter for supervision of other normal pregnancy, first trimester: Secondary | ICD-10-CM

## 2013-05-30 NOTE — Addendum Note (Signed)
Addended by: Julaine Hua on: 05/30/2013 06:13 PM   Modules accepted: Orders

## 2013-05-30 NOTE — Progress Notes (Signed)
Pulse- 86 . Subjective:    Megan Nolan is being seen today for her first obstetrical visit. (Age 27)  This is not a planned pregnancy. She is at [redacted]w[redacted]d gestation. Her obstetrical history is significant for ? pregnancy on last Depo Provera shot in May.. Relationship with FOB: significant other, not living together. Patient does intend to breast feed. Pregnancy history fully reviewed.  Menstrual History: OB History   Grav Para Term Preterm Abortions TAB SAB Ect Mult Living   2 1 1       1       Menarche age: 50 Patient's last menstrual period was 03/09/2013.    The following portions of the patient's history were reviewed and updated as appropriate: allergies, current medications, past family history, past medical history, past social history, past surgical history and problem list.  Review of Systems Pertinent items are noted in HPI.    Objective:    General appearance: alert and no distress Abdomen: normal findings: soft, non-tender Pelvic: cervix normal in appearance, external genitalia normal, no adnexal masses or tenderness, no cervical motion tenderness and vagina normal without discharge    Assessment:    Pregnancy at [redacted]w[redacted]d weeks    Plan:    Initial labs drawn. Prenatal vitamins.  Counseling provided regarding continued use of seat belts, cessation of alcohol consumption, smoking or use of illicit drugs; infection precautions i.e., influenza/TDAP immunizations, toxoplasmosis,CMV, parvovirus, listeria and varicella; workplace safety, exercise during pregnancy; routine dental care, safe medications, sexual activity, hot tubs, saunas, pools, travel, caffeine use, fish and methlymercury, potential toxins, hair treatments, varicose veins Weight gain recommendations reviewed: underweight/BMI< 18.5--> gain 28 - 40 lbs; normal weight/BMI 18.5 - 24.9--> gain 25 - 25 lbs; overweight/BMI 25 - 29.9--> gain 15 - 25 lbs; obese/BMI >30->gain  11 - 20 lbs Problem list reviewed and  updated. AFP3 discussed: requested. Role of ultrasound in pregnancy discussed; fetal survey: requested. Amniocentesis discussed: not indicated. Follow up in 2 weeks. 50% of 20 min visit spent on counseling and coordination of care.

## 2013-05-31 ENCOUNTER — Other Ambulatory Visit: Payer: Self-pay | Admitting: Obstetrics

## 2013-05-31 DIAGNOSIS — Z3687 Encounter for antenatal screening for uncertain dates: Secondary | ICD-10-CM

## 2013-05-31 LAB — GC/CHLAMYDIA PROBE AMP: CT Probe RNA: NEGATIVE

## 2013-05-31 LAB — OBSTETRIC PANEL
Basophils Absolute: 0 10*3/uL (ref 0.0–0.1)
Eosinophils Relative: 0 % (ref 0–5)
Lymphocytes Relative: 16 % (ref 12–46)
MCV: 100.3 fL — ABNORMAL HIGH (ref 78.0–100.0)
Neutro Abs: 8.2 10*3/uL — ABNORMAL HIGH (ref 1.7–7.7)
Platelets: 273 10*3/uL (ref 150–400)
RDW: 12.4 % (ref 11.5–15.5)
Rubella: 3.62 Index — ABNORMAL HIGH (ref <0.90)
WBC: 10.3 10*3/uL (ref 4.0–10.5)

## 2013-05-31 LAB — PAP IG W/ RFLX HPV ASCU

## 2013-05-31 LAB — HIV ANTIBODY (ROUTINE TESTING W REFLEX): HIV: NONREACTIVE

## 2013-05-31 LAB — VITAMIN D 25 HYDROXY (VIT D DEFICIENCY, FRACTURES): Vit D, 25-Hydroxy: 34 ng/mL (ref 30–89)

## 2013-06-04 LAB — HEMOGLOBINOPATHY EVALUATION
Hgb A: 96.4 % — ABNORMAL LOW (ref 96.8–97.8)
Hgb F Quant: 0.5 % (ref 0.0–2.0)
Hgb S Quant: 0 %

## 2013-06-05 ENCOUNTER — Encounter: Payer: Self-pay | Admitting: Obstetrics

## 2013-06-05 ENCOUNTER — Ambulatory Visit (HOSPITAL_COMMUNITY)
Admission: RE | Admit: 2013-06-05 | Discharge: 2013-06-05 | Disposition: A | Discharge status: Home / Self Care | Payer: Medicaid Other | Source: Ambulatory Visit | Attending: Obstetrics | Admitting: Obstetrics

## 2013-06-05 DIAGNOSIS — Z3687 Encounter for antenatal screening for uncertain dates: Secondary | ICD-10-CM

## 2013-06-05 DIAGNOSIS — Z3689 Encounter for other specified antenatal screening: Secondary | ICD-10-CM | POA: Insufficient documentation

## 2013-06-14 ENCOUNTER — Emergency Department (HOSPITAL_COMMUNITY)
Admission: EM | Admit: 2013-06-14 | Discharge: 2013-06-14 | Disposition: A | Discharge status: Home / Self Care | Payer: Medicaid Other | Attending: Emergency Medicine | Admitting: Emergency Medicine

## 2013-06-14 ENCOUNTER — Encounter: Payer: Medicaid Other | Admitting: Obstetrics

## 2013-06-14 ENCOUNTER — Encounter (HOSPITAL_COMMUNITY): Payer: Self-pay | Admitting: Emergency Medicine

## 2013-06-14 DIAGNOSIS — N949 Unspecified condition associated with female genital organs and menstrual cycle: Secondary | ICD-10-CM | POA: Insufficient documentation

## 2013-06-14 DIAGNOSIS — N938 Other specified abnormal uterine and vaginal bleeding: Secondary | ICD-10-CM | POA: Insufficient documentation

## 2013-06-14 DIAGNOSIS — R110 Nausea: Secondary | ICD-10-CM | POA: Insufficient documentation

## 2013-06-14 DIAGNOSIS — Z87891 Personal history of nicotine dependence: Secondary | ICD-10-CM | POA: Insufficient documentation

## 2013-06-14 DIAGNOSIS — R1084 Generalized abdominal pain: Secondary | ICD-10-CM | POA: Insufficient documentation

## 2013-06-14 DIAGNOSIS — Z8742 Personal history of other diseases of the female genital tract: Secondary | ICD-10-CM | POA: Insufficient documentation

## 2013-06-14 DIAGNOSIS — N939 Abnormal uterine and vaginal bleeding, unspecified: Secondary | ICD-10-CM

## 2013-06-14 DIAGNOSIS — Z8619 Personal history of other infectious and parasitic diseases: Secondary | ICD-10-CM | POA: Insufficient documentation

## 2013-06-14 DIAGNOSIS — R55 Syncope and collapse: Secondary | ICD-10-CM | POA: Insufficient documentation

## 2013-06-14 LAB — CBC WITH DIFFERENTIAL/PLATELET
Eosinophils Absolute: 0 10*3/uL (ref 0.0–0.7)
Lymphocytes Relative: 5 % — ABNORMAL LOW (ref 12–46)
Lymphs Abs: 0.8 10*3/uL (ref 0.7–4.0)
MCH: 35 pg — ABNORMAL HIGH (ref 26.0–34.0)
Neutrophils Relative %: 91 % — ABNORMAL HIGH (ref 43–77)
Platelets: 162 10*3/uL (ref 150–400)
RBC: 2.57 MIL/uL — ABNORMAL LOW (ref 3.87–5.11)
WBC: 16.3 10*3/uL — ABNORMAL HIGH (ref 4.0–10.5)

## 2013-06-14 LAB — BASIC METABOLIC PANEL
GFR calc non Af Amer: >90 mL/min (ref >90)
Glucose, Bld: 80 mg/dL (ref 70–99)
Potassium: 3.4 mEq/L — ABNORMAL LOW (ref 3.5–5.1)
Sodium: 136 mEq/L (ref 135–145)

## 2013-06-14 LAB — WET PREP, GENITAL
Clue Cells Wet Prep HPF POC: NONE SEEN
Trich, Wet Prep: NONE SEEN
Yeast Wet Prep HPF POC: NONE SEEN

## 2013-06-14 MED ORDER — MORPHINE SULFATE 4 MG/ML IJ SOLN
4.0000 mg | Freq: Once | INTRAMUSCULAR | Status: AC
  Administered 2013-06-14: 4 mg via INTRAVENOUS
  Filled 2013-06-14: qty 1

## 2013-06-14 MED ORDER — SODIUM CHLORIDE 0.9 % IV BOLUS (SEPSIS)
1000.0000 mL | Freq: Once | INTRAVENOUS | Status: AC
  Administered 2013-06-14: 1000 mL via INTRAVENOUS

## 2013-06-14 MED ORDER — ONDANSETRON HCL 4 MG/2ML IJ SOLN
4.0000 mg | Freq: Once | INTRAMUSCULAR | Status: AC
  Administered 2013-06-14: 4 mg via INTRAVENOUS
  Filled 2013-06-14: qty 2

## 2013-06-14 MED ORDER — IBUPROFEN 800 MG PO TABS: 800.0000 mg | ORAL_TABLET | Freq: Three times a day (TID) | ORAL | Status: DC

## 2013-06-14 MED ORDER — ONDANSETRON 4 MG PO TBDP: 4.0000 mg | ORAL_TABLET | Freq: Three times a day (TID) | ORAL | Status: DC | PRN

## 2013-06-14 NOTE — ED Provider Notes (Signed)
CSN: [id number]     Arrival date & time 06/14/13  1509 History     First MD Initiated Contact with Patient 06/14/13 1525     Chief Complaint  Patient presents with  . Loss of Consciousness   (Consider location/radiation/quality/duration/timing/severity/associated sxs/prior Treatment) The history is provided by the patient and medical records.   Pt presents to the ED for syncopal episode this afternoon.  Pt is a G2P1, approx [redacted] weeks gestation who had an elected abortion earlier today at approx 11am.  Pt states procedure was without complication and they sent her home with medications to take, she took the ergotamine but did not take the cipro.  Afterwards, she was at home relaxing when she began having some paresthesias of her hands and feet.  She attempted to get up to walk to her bedroom but passed out.  This was witnessed by her cousin who states that she was "down" for approx 7-8 minutes.  EMS arrived, gave IVF and now improvement of sx.  Is now having what feels like "contractions" in her lower abdomen with some nausea but thinks it is because she has not eaten.  Recent Gc/Chl testing on 05/30/13 which was negative.  Pt is O+ blood type.  Past Medical History  Diagnosis Date  . No pertinent past medical history   . Abnormal Pap smear   . Bacterial vaginosis    Past Surgical History  Procedure Laterality Date  . Cervix lesion destruction    . Wisdom tooth extraction     Family History  Problem Relation Age of Onset  . Cancer Maternal Grandmother    History  Substance Use Topics  . Smoking status: Former Smoker    Types: Cigarettes    Quit date: 04/01/2012  . Smokeless tobacco: Not on file  . Alcohol Use: Yes     Comment: occasionally   OB History   Grav Para Term Preterm Abortions TAB SAB Ect Mult Living   2 1 1       1      Review of Systems  Gastrointestinal: Positive for nausea and abdominal pain.  Genitourinary: Positive for vaginal bleeding.  Neurological:  Positive for syncope.  All other systems reviewed and are negative.   Allergies  Review of patient's allergies indicates no known allergies.  Home Medications  No current outpatient prescriptions on file. BP 99/53  Pulse 89  Temp(Src) 98.6 F (37 C) (Oral)  Resp 14  SpO2 98%  LMP 03/09/2013  Physical Exam  Nursing note and vitals reviewed. Constitutional: She is oriented to person, place, and time. She appears well-developed and well-nourished.  HENT:  Head: Normocephalic and atraumatic.  Mouth/Throat: Oropharynx is clear and moist.  Eyes: Conjunctivae and EOM are normal. Pupils are equal, round, and reactive to light.  Neck: Normal range of motion. Neck supple.  Cardiovascular: Normal rate, regular rhythm and normal heart sounds.   Pulmonary/Chest: Effort normal and breath sounds normal.  Abdominal: Soft. Bowel sounds are normal. There is no CVA tenderness, no tenderness at McBurney's point and negative Murphy's sign.  Diffuse lower abdominal discomfort without point tenderness  Genitourinary: There is no lesion on the right labia. There is no lesion on the left labia. Cervix exhibits no motion tenderness. Right adnexum displays no tenderness. Left adnexum displays no tenderness. There is bleeding around the vagina.  Cervical os open, large amount of blood and membranous tissue in the vaginal vault  Musculoskeletal: Normal range of motion. She exhibits no edema.  Neurological: She  is alert and oriented to person, place, and time.  Skin: Skin is warm and dry.  Psychiatric: She has a normal mood and affect.    ED Course   Procedures (including critical care time)  Labs Reviewed  CBC WITH DIFFERENTIAL - Abnormal; Notable for the following:    WBC 16.3 (*)    RBC 2.57 (*)    Hemoglobin 9.0 (*)    HCT 24.9 (*)    MCH 35.0 (*)    MCHC 36.1 (*)    Neutrophils Relative % 91 (*)    Neutro Abs 14.8 (*)    Lymphocytes Relative 5 (*)    All other components within normal  limits  BASIC METABOLIC PANEL - Abnormal; Notable for the following:    Potassium 3.4 (*)    Calcium 8.3 (*)    All other components within normal limits  WET PREP, GENITAL  GC/CHLAMYDIA PROBE AMP  URINALYSIS, ROUTINE W REFLEX MICROSCOPIC   No results found.  1. Vaginal bleeding   2. Generalized abdominal cramping     MDM   Mildly hypotensive on arrival, 99/53, improved with IVF.  H/H dropped as expected but still stable at 9.0/24.9.  Leukocytosis at 16.3, again as expected.  I have advised her that she will continue to pass large amounts of blood, clots, and membranous tissue which is normal.  Advised to continue taking meds given to her at visit this morning.  Pt afebrile, non-toxic appearing, NAD, VS stable- ok for discharge.  Rx zofran and motrin.  She will FU with her OB-GYN, Dr. Clearance Coots if problems occur.  Discussed plan with pt, she agreed.  Return precautions advised.  Garlon Hatchet, PA-C 06/14/13 2303

## 2013-06-14 NOTE — ED Notes (Signed)
Was 16 week preg and had abortion today has been home for a couple of hours had been fine sudden onset of tingling to hands and feet stood to go lay down and became diaphortic and passed out. Upon ems arrival pt  bp 88  Has IV feeling better now c/o lower abd cramping and some vag bleeding  Took meds ergonmine but did not start cipro

## 2013-06-15 LAB — GC/CHLAMYDIA PROBE AMP: GC Probe RNA: NEGATIVE

## 2013-06-15 NOTE — ED Provider Notes (Signed)
Medical screening examination/treatment/procedure(s) were performed by non-physician practitioner and as supervising physician I was immediately available for consultation/collaboration.   Gavin Pound. Presli Fanguy, MD 06/15/13 [phone]

## 2013-06-25 ENCOUNTER — Encounter: Payer: Self-pay | Admitting: *Deleted

## 2013-08-15 ENCOUNTER — Encounter: Payer: Self-pay | Admitting: Obstetrics

## 2013-09-11 ENCOUNTER — Encounter (HOSPITAL_COMMUNITY): Payer: Self-pay | Admitting: Emergency Medicine

## 2013-09-11 ENCOUNTER — Emergency Department (INDEPENDENT_AMBULATORY_CARE_PROVIDER_SITE_OTHER)
Admission: EM | Admit: 2013-09-11 | Discharge: 2013-09-11 | Disposition: A | Discharge status: Home / Self Care | Payer: Medicaid Other | Source: Home / Self Care | Attending: Family Medicine | Admitting: Family Medicine

## 2013-09-11 DIAGNOSIS — W57XXXA Bitten or stung by nonvenomous insect and other nonvenomous arthropods, initial encounter: Secondary | ICD-10-CM

## 2013-09-11 DIAGNOSIS — T148 Other injury of unspecified body region: Secondary | ICD-10-CM

## 2013-09-11 MED ORDER — HYDROXYZINE HCL 25 MG PO TABS: 25.0000 mg | ORAL_TABLET | Freq: Four times a day (QID) | ORAL | Status: DC

## 2013-09-11 MED ORDER — PERMETHRIN 5 % EX CREA: TOPICAL_CREAM | CUTANEOUS | Status: DC

## 2013-09-11 MED ORDER — DIPHENHYDRAMINE HCL 25 MG PO CAPS
50.0000 mg | ORAL_CAPSULE | Freq: Once | ORAL | Status: AC
  Administered 2013-09-11: 50 mg via ORAL

## 2013-09-11 MED ORDER — DIPHENHYDRAMINE HCL 25 MG PO CAPS
ORAL_CAPSULE | ORAL | Status: AC
  Filled 2013-09-11: qty 2

## 2013-09-11 NOTE — ED Provider Notes (Signed)
Megan Nolan is a 27 y.o. female who presents to Urgent Care today for pruritic papules. Patient notes extremely itchy bumps across her extremities face neck and back. This started after she slept on her friend's couch. She is suspicious for bedbugs. No fevers chills nausea vomiting or diarrhea. No medications tried yet. She feels well otherwise. No other housemates with similar rash yet.    Past Medical History  Diagnosis Date  . No pertinent past medical history   . Abnormal Pap smear   . Bacterial vaginosis    History  Substance Use Topics  . Smoking status: Former Smoker    Types: Cigarettes    Quit date: 04/01/2012  . Smokeless tobacco: Not on file  . Alcohol Use: Yes     Comment: occasionally   ROS as above Medications reviewed. No current facility-administered medications for this encounter.   Current Outpatient Prescriptions  Medication Sig Dispense Refill  . hydrOXYzine (ATARAX/VISTARIL) 25 MG tablet Take 1 tablet (25 mg total) by mouth every 6 (six) hours.  30 tablet  0  . ibuprofen (ADVIL,MOTRIN) 800 MG tablet Take 1 tablet (800 mg total) by mouth 3 (three) times daily.  21 tablet  0  . permethrin (ELIMITE) 5 % cream Apply from the neck down at night and wash off in the morning once  180 g  1    Exam:  BP 142/88  Pulse 73  Temp(Src) 99.1 F (37.3 C) (Oral)  Resp 17  SpO2 99%  LMP 03/09/2013  Breastfeeding? Unknown Gen: Well NAD SKIN: Multiple urticarial in appearance papules across extremities neck face and back. Consistent with bed bug bites.  No results found for this or any previous visit (from the past 24 hour(s)). No results found.  Assessment and Plan: 27 y.o. female with bug bites. Either scabies or bedbugs. Plan to treat with permethrin, hydroxyzine, Gold Bond Itch. Patient will investigate her home and her friend's home. Return as needed Discussed warning signs or symptoms. Please see discharge instructions. Patient expresses  understanding.      Rodolph Bong, MD 09/11/13 1050

## 2013-09-11 NOTE — ED Notes (Signed)
Red, itchy bumps, noticed yesterday

## 2014-02-12 ENCOUNTER — Inpatient Hospital Stay (EMERGENCY_DEPARTMENT_HOSPITAL)
Admission: AD | Admit: 2014-02-12 | Discharge: 2014-02-12 | Disposition: A | Discharge status: Home / Self Care | Payer: Medicaid Other | Source: Ambulatory Visit | Attending: Obstetrics & Gynecology | Admitting: Obstetrics & Gynecology

## 2014-02-12 ENCOUNTER — Encounter (HOSPITAL_COMMUNITY): Payer: Self-pay | Admitting: Emergency Medicine

## 2014-02-12 ENCOUNTER — Encounter (HOSPITAL_COMMUNITY): Payer: Self-pay | Admitting: *Deleted

## 2014-02-12 ENCOUNTER — Emergency Department (HOSPITAL_COMMUNITY)
Admission: EM | Admit: 2014-02-12 | Discharge: 2014-02-12 | Disposition: A | Discharge status: Home / Self Care | Payer: Medicaid Other | Attending: Emergency Medicine | Admitting: Emergency Medicine

## 2014-02-12 DIAGNOSIS — Z803 Family history of malignant neoplasm of breast: Secondary | ICD-10-CM

## 2014-02-12 DIAGNOSIS — Z79899 Other long term (current) drug therapy: Secondary | ICD-10-CM | POA: Insufficient documentation

## 2014-02-12 DIAGNOSIS — N611 Abscess of the breast and nipple: Secondary | ICD-10-CM

## 2014-02-12 DIAGNOSIS — N644 Mastodynia: Secondary | ICD-10-CM | POA: Insufficient documentation

## 2014-02-12 DIAGNOSIS — N61 Mastitis without abscess: Secondary | ICD-10-CM | POA: Insufficient documentation

## 2014-02-12 DIAGNOSIS — Z87891 Personal history of nicotine dependence: Secondary | ICD-10-CM | POA: Insufficient documentation

## 2014-02-12 HISTORY — DX: Other specified health status: Z78.9

## 2014-02-12 MED ORDER — OXYCODONE-ACETAMINOPHEN 5-325 MG PO TABS: 1.0000 | ORAL_TABLET | Freq: Four times a day (QID) | ORAL | Status: DC | PRN

## 2014-02-12 MED ORDER — SULFAMETHOXAZOLE-TMP DS 800-160 MG PO TABS: 1.0000 | ORAL_TABLET | Freq: Three times a day (TID) | ORAL | Status: DC

## 2014-02-12 NOTE — MAU Provider Note (Signed)
History     CSN: [id number]  Arrival date and time: 02/12/14 1321   None     Chief Complaint  Patient presents with  . Breast Pain   HPI Megan Nolan is a 28 year old female presenting for new onset 1 day history of soreness in her right breast. The patient reports that 3 weeks ago she had her nipples pierced and has been using a bar bell type ring. She admits that despite cleaning the site 3 times a day, she notices some oozing of pus bilaterally around her nipples. Yesterday while working, the patient noticed soreness in her right breast which has since progressively worsened. She stopped using a bra as light touch and movement aggravates the pain and has not taking anything for the pain. The patient denies fever, chills, N/V, urinary and bowel movement changes, sob, cough, chest pain and abdominal pain. She also denies currently being pregnant and breast feeding. Her past medical history is non-contributory but she does admit a maternal and paternal family history of breast cancer, denies it for her immediate family.  OB History   Grav Para Term Preterm Abortions TAB SAB Ect Mult Living   3 1 1  1  1   1       Past Medical History  Diagnosis Date  . No pertinent past medical history   . Abnormal Pap smear   . Bacterial vaginosis   . Medical history non-contributory     Past Surgical History  Procedure Laterality Date  . Cervix lesion destruction    . Wisdom tooth extraction      Family History  Problem Relation Age of Onset  . Cancer Maternal Grandmother     History  Substance Use Topics  . Smoking status: Former Smoker    Types: Cigarettes    Quit date: 04/01/2012  . Smokeless tobacco: Not on file  . Alcohol Use: Yes     Comment: occasionally    Allergies: No Known Allergies  Prescriptions prior to admission  Medication Sig Dispense Refill  . medroxyPROGESTERone (DEPO-PROVERA) 150 MG/ML injection Inject 150 mg into the muscle every 3 (three) months.      .  Multiple Vitamins-Minerals (HAIR/SKIN/NAILS PO) Take 2 capsules by mouth daily.        Review of Systems  Constitutional: Negative for fever, chills and diaphoresis.  HENT: Negative for sore throat.   Eyes: Negative for blurred vision.  Respiratory: Negative for cough and shortness of breath.   Cardiovascular: Negative for chest pain and leg swelling.  Gastrointestinal: Negative for nausea, vomiting, abdominal pain, diarrhea, constipation and blood in stool.  Genitourinary: Negative for dysuria, urgency, frequency, hematuria and flank pain.  Musculoskeletal: Negative for myalgias.  Neurological: Negative for dizziness and headaches.   Physical Exam   Blood pressure 106/56, pulse 77, temperature 98 F (36.7 C), temperature source Oral, resp. rate 18, height 5\' 7"  (1.702 m), weight 61.78 kg (136 lb 3.2 oz), last menstrual period 03/09/2013, SpO2 99.00%, unknown if currently breastfeeding.  Physical Exam  Constitutional: She appears well-developed and well-nourished. No distress.  HENT:  Head: Normocephalic.  Eyes: Pupils are equal, round, and reactive to light. No scleral icterus.  Neck: Normal range of motion. No tracheal deviation present.  Cardiovascular: Normal rate, regular rhythm and intact distal pulses.  Exam reveals no gallop and no friction rub.   Murmur (Systolic ejection murmur best heard in RUSB) heard. Respiratory: Effort normal and breath sounds normal. No stridor. She exhibits no tenderness. Right breast  exhibits mass and tenderness. Right breast exhibits no inverted nipple, no nipple discharge (slight crusting bilaterally on either side of the bar bell piercing) and no skin change (non-erythematous). Left breast exhibits no inverted nipple, no mass, no nipple discharge, no skin change and no tenderness.    GI: Soft. Bowel sounds are normal. She exhibits no distension. There is no tenderness. There is no guarding.  Skin: Skin is warm and dry. No rash noted. She is not  diaphoretic. No erythema.    MAU Course  Procedures  MDM Patient will be referred to [facility] ED for further evaluation and surgical consult for possible breast abscess.  Assessment and Plan  Ms. Shiffman is a 28 year old female presenting for new onset 1 day history of soreness in her right breast. 3 weeks ago the patient had her nipples pierced and on exam she has a fluctuant, exquisitely tender mass presumed to be an abscess. She will be referred to the [facility] ED for further evaluation and surgical consult. Patient agreed to drive to [facility] today.   Wallis Bamberg 02/12/2014, 3:40 PM   Seen and examined by me also Abscess is just below areola.  Fluctuant with no pointing.  There is dried drainage at both piercing sites on nipples. Discussed with Dr Macon Large who wants her to go to [facility] for surgeon to evaluate her I called [facility] who said she was [facility] and that there might be a delay in getting her into that clinic soon. They recommend she see surgeon at [facility].  Tried to call a head twice there and could not get through.  Megan Nolan, CNM

## 2014-02-12 NOTE — ED Notes (Signed)
[facility] Pt reports noticed knot under R breast yesterday, painful to the touch and hurts with movement.

## 2014-02-12 NOTE — MAU Note (Signed)
Patient states she has had pain under the right breast since yesterday.

## 2014-02-12 NOTE — Discharge Instructions (Signed)
Mastitis   Mastitis is inflammation of the breast tissue. It occurs most often in women who are breastfeeding, but it can also affect other women, and even sometimes men.  CAUSES   Mastitis is usually caused by a bacterial infection. Bacteria enter the breast tissue through cuts or openings in the skin. Typically, this occurs with breastfeeding because of cracked or irritated skin. Sometimes, it can occur even when there is no opening in the skin. It can be associated with plugged milk (lactiferous) ducts. Nipple piercing can also lead to mastitis. Also, some forms of breast cancer can cause mastitis.  SIGNS AND SYMPTOMS   · Swelling, redness, tenderness, and pain in an area of the breast.  · Swelling of the glands under the arm on the same side.  · Fever.  If an infection is allowed to progress, a collection of pus (abscess) may develop.  DIAGNOSIS   Your health care provider can usually diagnose mastitis based on your symptoms and a physical exam. Tests may be done to help confirm the diagnosis. These may include:   · Removal of pus from the breast by applying pressure to the area. This pus can be examined in the lab to determine which bacteria are present. If an abscess has developed, the fluid in the abscess can be removed with a needle. This can also be used to confirm the diagnosis and determine the bacteria present. In most cases, pus will not be present.  · Blood tests to determine if your body is fighting a bacterial infection.  · Mammogram or ultrasound tests to rule out other problems or diseases.  TREATMENT   Antibiotic medicine is used to treat a bacterial infection. Your health care provider will determine which bacteria are most likely causing the infection and will select an appropriate antibiotic. This is sometimes changed based on the results of tests performed to identify the bacteria, or if there is no response to the antibiotic selected. Antibiotics are usually given by mouth. You may also be  given medicine for pain.  Mastitis that occurs with breastfeeding will sometimes go away on its own, so your health care provider may choose to wait 24 hours after first seeing you to decide whether a prescription medicine is needed.  HOME CARE INSTRUCTIONS   · Only take over-the-counter or prescription medicines for pain, fever, or discomfort as directed by your health care provider.  · If your health care provider prescribed an antibiotic, take the medicine as directed. Make sure you finish it even if you start to feel better.  · Do not wear a tight or underwire bra. Wear a soft, supportive bra.  · Increase your fluid intake, especially if you have a fever.  · Women who are breastfeeding should follow these instructions:  · Continue to empty the breast. Your health care provider can tell you whether this milk is safe for your infant or needs to be thrown out. You may be told to stop nursing until your health care provider thinks it is safe for your baby. Use a breast pump if you are advised to stop nursing.  · Keep your nipples clean and dry.  · Empty the first breast completely before going to the other breast. If your baby is not emptying your breasts completely for some reason, use a breast pump to empty your breasts.  · If you go back to work, pump your breasts while at work to stay in time with your nursing schedule.  · Avoid   allowing your breasts to become overly filled with milk (engorged).  SEEK MEDICAL CARE IF:   · You have pus-like discharge from the breast.  · Your symptoms do not improve with the treatment prescribed by your health care provider within 2 days.  SEEK IMMEDIATE MEDICAL CARE IF:   · Your pain and swelling are getting worse.  · You have pain that is not controlled with medicine.  · You have a red line extending from the breast toward your armpit.  · You have a fever or persistent symptoms for more than 2 3 days.  · You have a fever and your symptoms suddenly get worse.  Document Released:  11/01/2005 Document Revised: 08/22/2013 Document Reviewed: 06/01/2013  ExitCare® Patient Information ©2014 [employer].

## 2014-02-12 NOTE — MAU Note (Signed)
C/o R breast pain since yesterday; states that she feels a lump underneath the R breast;

## 2014-02-12 NOTE — ED Provider Notes (Signed)
CSN: [id number]     Arrival date & time 02/12/14  1619 History   First MD Initiated Contact with Patient 02/12/14 1846     Chief Complaint  Patient presents with  . Knot under R breast      (Consider location/radiation/quality/duration/timing/severity/associated sxs/prior Treatment) The history is provided by the patient.   patient had bilateral nipple piercings placed a couple weeks ago. She states last night she felt pain and swelling on the lower part of her right breast. No  Discharge from the nipple. No fevers.no trauma. She states the swelling has not gotten any larger over the day. She was sent to was too long ED for [facility] hospital for surgical evaluation.   Past Medical History  Diagnosis Date  . No pertinent past medical history   . Abnormal Pap smear   . Bacterial vaginosis   . Medical history non-contributory    Past Surgical History  Procedure Laterality Date  . Cervix lesion destruction    . Wisdom tooth extraction     Family History  Problem Relation Age of Onset  . Cancer Maternal Grandmother    History  Substance Use Topics  . Smoking status: Former Smoker    Types: Cigarettes    Quit date: 04/01/2012  . Smokeless tobacco: Not on file  . Alcohol Use: Yes     Comment: occasionally   OB History   Grav Para Term Preterm Abortions TAB SAB Ect Mult Living   3 1 1  1  1   1      Review of Systems  Constitutional: Negative for chills, appetite change and fatigue.  Respiratory: Negative for shortness of breath.   Musculoskeletal: Negative for back pain.  Skin: Positive for color change. Negative for wound.      Allergies  Review of patient's allergies indicates no known allergies.  Home Medications   Current Outpatient Rx  Name  Route  Sig  Dispense  Refill  . medroxyPROGESTERone (DEPO-PROVERA) 150 MG/ML injection   Intramuscular   Inject 150 mg into the muscle every 3 (three) months.         . Multiple Vitamins-Minerals (HAIR/SKIN/NAILS PO)   Oral   Take 2 capsules by mouth daily.         Marland Kitchen oxyCODONE-acetaminophen (PERCOCET/ROXICET) 5-325 MG per tablet   Oral   Take 1-2 tablets by mouth every 6 (six) hours as needed for severe pain.   10 tablet   0   . sulfamethoxazole-trimethoprim (BACTRIM DS) 800-160 MG per tablet   Oral   Take 1 tablet by mouth 3 (three) times daily.   12 tablet   0    BP 117/61  Pulse 89  Temp(Src) 98.8 F (37.1 C) (Oral)  Resp 18  Ht 5' 6.5" (1.689 m)  Wt 137 lb (62.143 kg)  BMI 21.78 kg/m2  SpO2 100%  LMP 03/09/2013 Physical Exam  Constitutional: She appears well-developed.  HENT:  Head: Normocephalic.  Cardiovascular: Normal rate and regular rhythm.   Pulmonary/Chest: Effort normal and breath sounds normal.   bilateral nipples pierced. There is a firm mass inferior aspect of the right breast. Approximately 34 cm in diameter. No nipple drainage. Mild erythema on the skin without skin induration.  ED Course  Procedures (including critical care time) Labs Review Labs Reviewed - No data to display Imaging Review No results found.   EKG Interpretation None      MDM   Final diagnoses:  Mastitis    Patient with likely mastitis versus  early abscess on right breast. After discussion with general surgery patient's nipple ring was removed. There was no purulent drainage. Patient will be given warm soaks antibiotics and will followup in the surgical clinic in the next one to 2 days.    Juliet RudeNathan R. Rubin PayorPickering, MD 02/12/14 2352

## 2014-02-12 NOTE — Discharge Instructions (Signed)
Abscess An abscess is an infected area that contains a collection of pus and debris.It can occur in almost any part of the body. An abscess is also known as a furuncle or boil. CAUSES  An abscess occurs when tissue gets infected. This can occur from blockage of oil or sweat glands, infection of hair follicles, or a minor injury to the skin. As the body tries to fight the infection, pus collects in the area and creates pressure under the skin. This pressure causes pain. People with weakened immune systems have difficulty fighting infections and get certain abscesses more often.  SYMPTOMS Usually an abscess develops on the skin and becomes a painful mass that is red, warm, and tender. If the abscess forms under the skin, you may feel a moveable soft area under the skin. Some abscesses break open (rupture) on their own, but most will continue to get worse without care. The infection can spread deeper into the body and eventually into the bloodstream, causing you to feel ill.  DIAGNOSIS  Your caregiver will take your medical history and perform a physical exam. A sample of fluid may also be taken from the abscess to determine what is causing your infection. TREATMENT  Your caregiver may prescribe antibiotic medicines to fight the infection. However, taking antibiotics alone usually does not cure an abscess. Your caregiver may need to make a small cut (incision) in the abscess to drain the pus. In some cases, gauze is packed into the abscess to reduce pain and to continue draining the area. HOME CARE INSTRUCTIONS   Only take over-the-counter or prescription medicines for pain, discomfort, or fever as directed by your caregiver.  If you were prescribed antibiotics, take them as directed. Finish them even if you start to feel better.  If gauze is used, follow your caregiver's directions for changing the gauze.  To avoid spreading the infection:  Keep your draining abscess covered with a  bandage.  Wash your hands well.  Do not share personal care items, towels, or whirlpools with others.  Avoid skin contact with others.  Keep your skin and clothes clean around the abscess.  Keep all follow-up appointments as directed by your caregiver. SEEK MEDICAL CARE IF:   You have increased pain, swelling, redness, fluid drainage, or bleeding.  You have muscle aches, chills, or a general ill feeling.  You have a fever. MAKE SURE YOU:   Understand these instructions.  Will watch your condition.  Will get help right away if you are not doing well or get worse. Document Released: 08/11/2005 Document Revised: 05/02/2012 Document Reviewed: 01/14/2012 ExitCare Patient Information 2014 [employer].  

## 2014-02-13 NOTE — MAU Provider Note (Signed)
Attestation of Attending Supervision of Advanced Practitioner (PA/CNM/NP): Evaluation and management procedures were performed by the Advanced Practitioner under my supervision and collaboration.  I have reviewed the Advanced Practitioner's note and chart, and I agree with the management and plan.  Melvyn Hommes, MD, FACOG Attending Obstetrician & Gynecologist Faculty Practice, [facility]  

## 2014-09-16 ENCOUNTER — Encounter (HOSPITAL_COMMUNITY): Payer: Self-pay | Admitting: Emergency Medicine

## 2014-10-05 ENCOUNTER — Encounter (HOSPITAL_COMMUNITY): Payer: Self-pay | Admitting: Emergency Medicine

## 2014-10-05 ENCOUNTER — Emergency Department (HOSPITAL_COMMUNITY)
Admission: EM | Admit: 2014-10-05 | Discharge: 2014-10-05 | Disposition: A | Discharge status: Home / Self Care | Payer: Medicaid Other | Attending: Emergency Medicine | Admitting: Emergency Medicine

## 2014-10-05 DIAGNOSIS — Z3202 Encounter for pregnancy test, result negative: Secondary | ICD-10-CM | POA: Insufficient documentation

## 2014-10-05 DIAGNOSIS — Z87891 Personal history of nicotine dependence: Secondary | ICD-10-CM | POA: Insufficient documentation

## 2014-10-05 DIAGNOSIS — Z79899 Other long term (current) drug therapy: Secondary | ICD-10-CM | POA: Insufficient documentation

## 2014-10-05 DIAGNOSIS — Z792 Long term (current) use of antibiotics: Secondary | ICD-10-CM | POA: Insufficient documentation

## 2014-10-05 DIAGNOSIS — N760 Acute vaginitis: Secondary | ICD-10-CM | POA: Insufficient documentation

## 2014-10-05 DIAGNOSIS — B9689 Other specified bacterial agents as the cause of diseases classified elsewhere: Secondary | ICD-10-CM

## 2014-10-05 LAB — URINALYSIS, ROUTINE W REFLEX MICROSCOPIC
Bilirubin Urine: NEGATIVE
GLUCOSE, UA: NEGATIVE mg/dL
HGB URINE DIPSTICK: NEGATIVE
KETONES UR: NEGATIVE mg/dL
Nitrite: NEGATIVE
PROTEIN: NEGATIVE mg/dL
Specific Gravity, Urine: 1.021 (ref 1.005–1.030)
UROBILINOGEN UA: 0.2 mg/dL (ref 0.0–1.0)
pH: 5.5 (ref 5.0–8.0)

## 2014-10-05 LAB — WET PREP, GENITAL
Trich, Wet Prep: NONE SEEN
Yeast Wet Prep HPF POC: NONE SEEN

## 2014-10-05 LAB — URINE MICROSCOPIC-ADD ON

## 2014-10-05 LAB — RPR

## 2014-10-05 LAB — HIV ANTIBODY (ROUTINE TESTING W REFLEX): HIV 1&2 Ab, 4th Generation: NONREACTIVE

## 2014-10-05 LAB — PREGNANCY, URINE: Preg Test, Ur: NEGATIVE

## 2014-10-05 MED ORDER — AZITHROMYCIN 1 G PO PACK
1.0000 g | PACK | Freq: Once | ORAL | Status: AC
  Administered 2014-10-05: 1 g via ORAL
  Filled 2014-10-05: qty 1

## 2014-10-05 MED ORDER — METRONIDAZOLE 500 MG PO TABS
500.0000 mg | ORAL_TABLET | Freq: Once | ORAL | Status: AC
  Administered 2014-10-05: 500 mg via ORAL
  Filled 2014-10-05: qty 1

## 2014-10-05 MED ORDER — LIDOCAINE HCL (PF) 1 % IJ SOLN
INTRAMUSCULAR | Status: AC
  Administered 2014-10-05: 5 mL
  Filled 2014-10-05: qty 5

## 2014-10-05 MED ORDER — METRONIDAZOLE 500 MG PO TABS: 500.0000 mg | ORAL_TABLET | Freq: Two times a day (BID) | ORAL | Status: DC

## 2014-10-05 MED ORDER — CEFTRIAXONE SODIUM 250 MG IJ SOLR
250.0000 mg | Freq: Once | INTRAMUSCULAR | Status: AC
  Administered 2014-10-05: 250 mg via INTRAMUSCULAR
  Filled 2014-10-05: qty 250

## 2014-10-05 NOTE — Discharge Instructions (Signed)
Take flagyl twice a day for a week.   Stay hydrated.   You have been treated for STD. If cultures are positive, you will be called and your partner will need treatment.   Follow up with your doctor.   Return to ER if you have worse discharge, fever, severe pain.

## 2014-10-05 NOTE — ED Provider Notes (Signed)
CSN: [id number]     Arrival date & time 10/05/14  0033 History   First MD Initiated Contact with Patient 10/05/14 0052     This chart was scribed for Megan Canalavid H Yao, MD by Arlan OrganAshley Leger, ED Scribe. This patient was seen in room D30C/D30C and the patient's care was started 2:21 AM.   Chief Complaint  Patient presents with  . Vaginal Discharge   The history is provided by the patient. No language interpreter was used.    HPI Comments: Megan FallsSequoia M Nolan is a 28 y.o. female who presents to the Emergency Department complaining of constant, moderate vaginal discharge x 3 days that is unchanged. Discharge is described as white. She also reports vaginal itching. No dysuria, fever, or chills. Last sexual encounter 1 month ago without any protection. She denies multiple sexual partners and reports same sexual partner for last 5 years. Last diagnosis for STI several years ago. No known allergies to medications.  Past Medical History  Diagnosis Date  . No pertinent past medical history   . Abnormal Pap smear   . Bacterial vaginosis   . Medical history non-contributory    Past Surgical History  Procedure Laterality Date  . Cervix lesion destruction    . Wisdom tooth extraction     Family History  Problem Relation Age of Onset  . Cancer Maternal Grandmother    History  Substance Use Topics  . Smoking status: Former Smoker    Types: Cigarettes    Quit date: 04/01/2012  . Smokeless tobacco: Not on file  . Alcohol Use: Yes     Comment: occasionally   OB History    Gravida Para Term Preterm AB TAB SAB Ectopic Multiple Living   3 1 1  1  1   1      Review of Systems  Constitutional: Negative for fever and chills.  Genitourinary: Positive for vaginal discharge. Negative for dysuria and vaginal bleeding.  All other systems reviewed and are negative.     Allergies  Review of patient's allergies indicates no known allergies.  Home Medications   Prior to Admission medications    Medication Sig Start Date End Date Taking? Authorizing Provider  medroxyPROGESTERone (DEPO-PROVERA) 150 MG/ML injection Inject 150 mg into the muscle every 3 (three) months.    Historical Provider, MD  Multiple Vitamins-Minerals (HAIR/SKIN/NAILS PO) Take 2 capsules by mouth daily.    Historical Provider, MD  oxyCODONE-acetaminophen (PERCOCET/ROXICET) 5-325 MG per tablet Take 1-2 tablets by mouth every 6 (six) hours as needed for severe pain. 02/12/14   Juliet RudeNathan R. Pickering, MD  sulfamethoxazole-trimethoprim (BACTRIM DS) 800-160 MG per tablet Take 1 tablet by mouth 3 (three) times daily. 02/12/14   Juliet RudeNathan R. Rubin PayorPickering, MD   Triage Vitals: BP 123/71 mmHg  Pulse 74  Temp(Src) 97.9 F (36.6 C) (Oral)  Resp 20  Ht 5\' 6"  (1.676 m)  Wt 137 lb (62.143 kg)  BMI 22.12 kg/m2  SpO2 100%   Physical Exam  Constitutional: She is oriented to person, place, and time. She appears well-developed and well-nourished.  HENT:  Head: Normocephalic.  Eyes: EOM are normal.  Neck: Normal range of motion.  Cardiovascular: Normal rate, regular rhythm and normal heart sounds.   Pulmonary/Chest: Effort normal and breath sounds normal. She has no wheezes.  Abdominal: Soft. Bowel sounds are normal. She exhibits no distension. There is no tenderness.  Abdomen non-tender   Genitourinary:  No CMT No uterine or adnexal tenderness Whitish discharge noted  Musculoskeletal: Normal range of  motion.  Neurological: She is alert and oriented to person, place, and time.  Psychiatric: She has a normal mood and affect.  Nursing note and vitals reviewed.   ED Course  Procedures (including critical care time)  DIAGNOSTIC STUDIES: Oxygen Saturation is 98% on RA, Normal by my interpretation.    COORDINATION OF CARE: 12: 20 AM Will order RPR, urinalysis, HIV antibody, and GC/Chalmydia. Discussed treatment plan with pt at bedside and pt agreed to plan.     Labs Review Labs Reviewed  WET PREP, GENITAL - Abnormal; Notable  for the following:    Clue Cells Wet Prep HPF POC MODERATE (*)    WBC, Wet Prep HPF POC MODERATE (*)    All other components within normal limits  URINALYSIS, ROUTINE W REFLEX MICROSCOPIC - Abnormal; Notable for the following:    Leukocytes, UA TRACE (*)    All other components within normal limits  GC/CHLAMYDIA PROBE AMP  URINE MICROSCOPIC-ADD ON  PREGNANCY, URINE  RPR  HIV ANTIBODY (ROUTINE TESTING)    Imaging Review No results found.   EKG Interpretation None      MDM   Final diagnoses:  None   Megan Nolan is a 28 y.o. female here with vaginal discharge. GC/chlamydia sent, treated empirically. Wet prep + clue cells. Given flagyl. RPR, HIV sent.   I personally performed the services described in this documentation, which was scribed in my presence. The recorded information has been reviewed and is accurate.    Megan Canalavid H Yao, MD 10/05/14 Earle [id number]

## 2014-10-05 NOTE — ED Notes (Signed)
Pt. repors vaginal itching with malodorous discharge onset 3 days ago . Denies dysuria . No fever or chills.

## 2014-10-08 LAB — GC/CHLAMYDIA PROBE AMP
CT PROBE, AMP APTIMA: NEGATIVE
GC PROBE AMP APTIMA: NEGATIVE

## 2015-06-01 IMAGING — US US OB COMP LESS 14 WK
2 series · 13 of 22 positions shown · non-contrast
Comparison: none

[Series 1: us ob comp less 14 wks · 12 of 21 slices shown (1 of 2)]
[im 1/21]
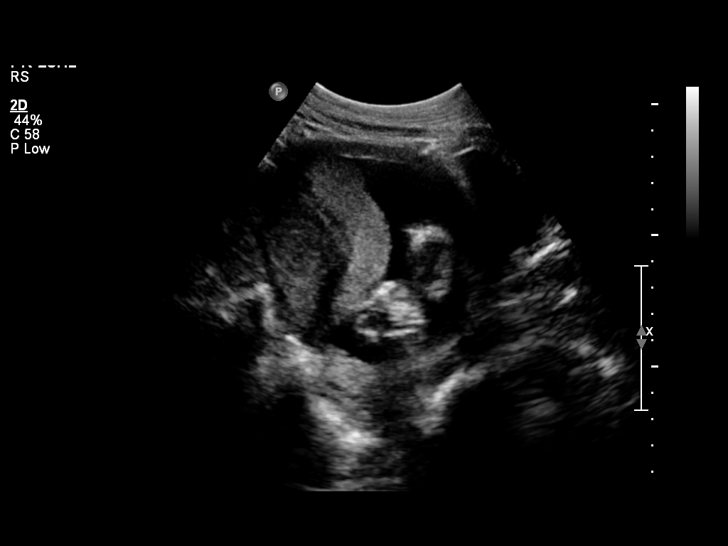
[im 3/21]
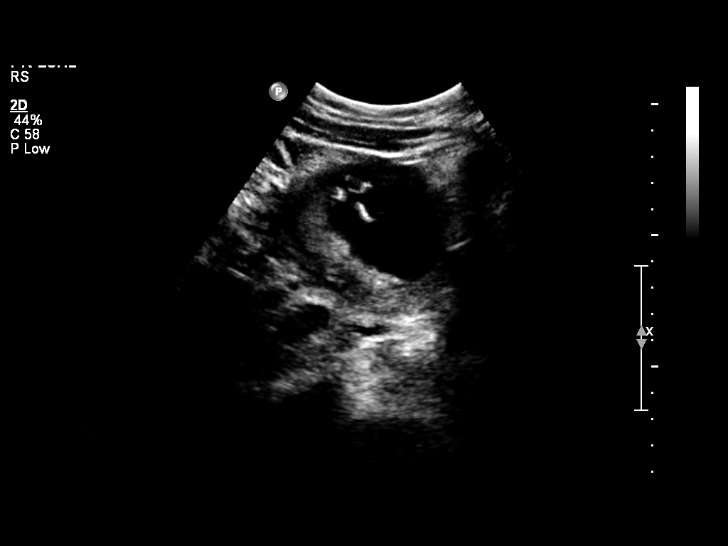
[im 5/21]
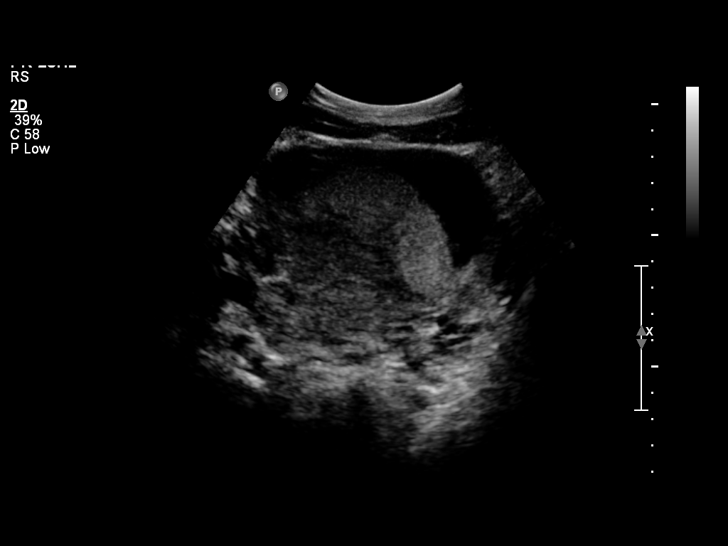
[im 6/21]
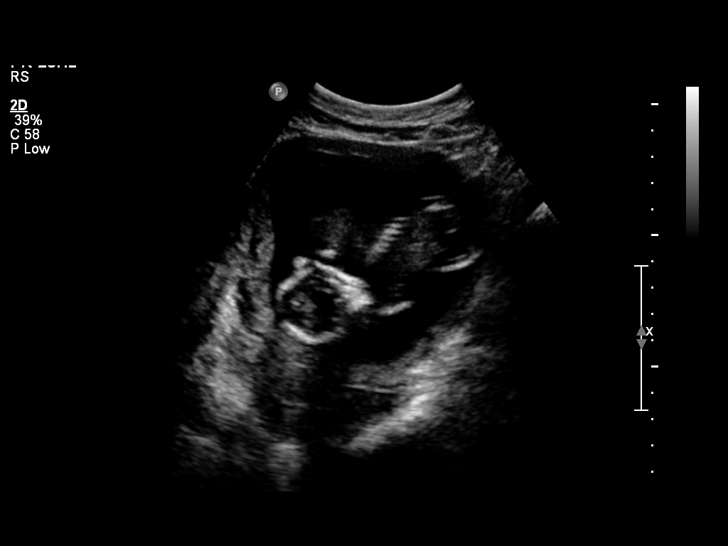
[im 8/21]
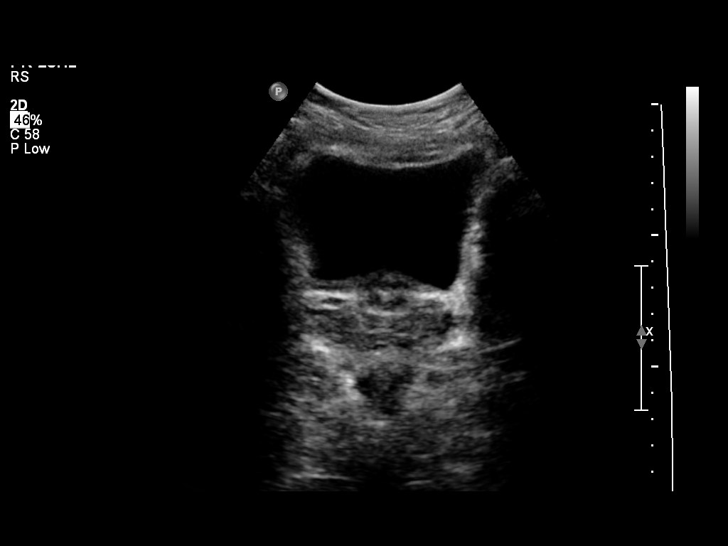
[im 10/21]
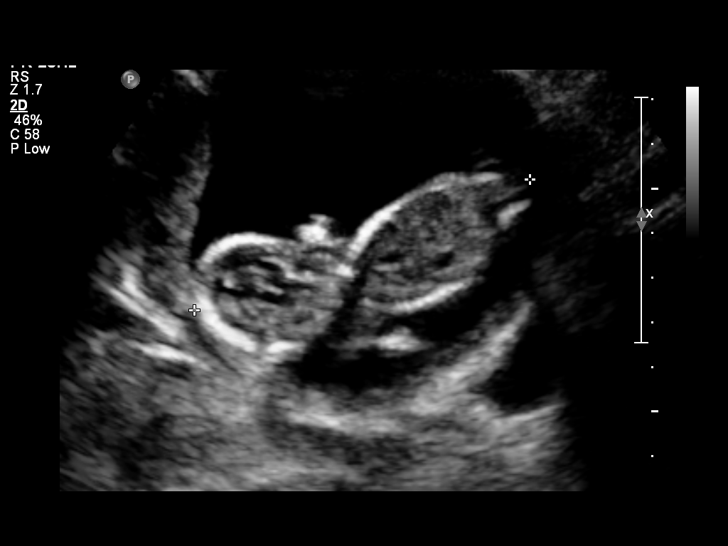
[im 12/21]
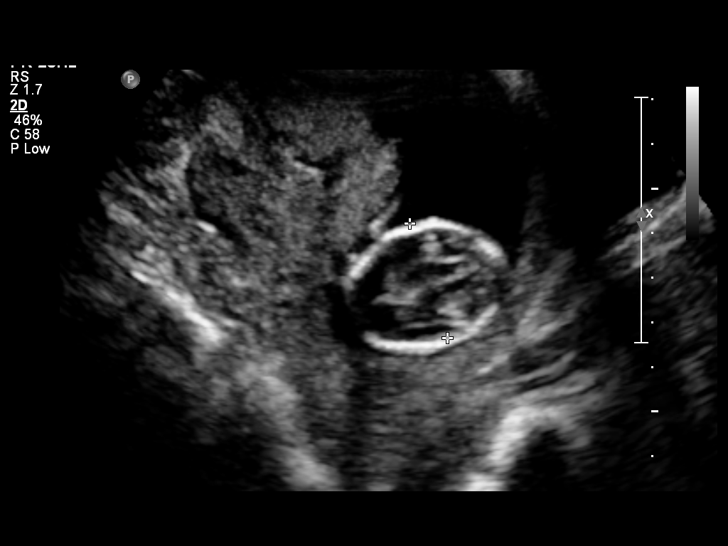
[im 13/21]
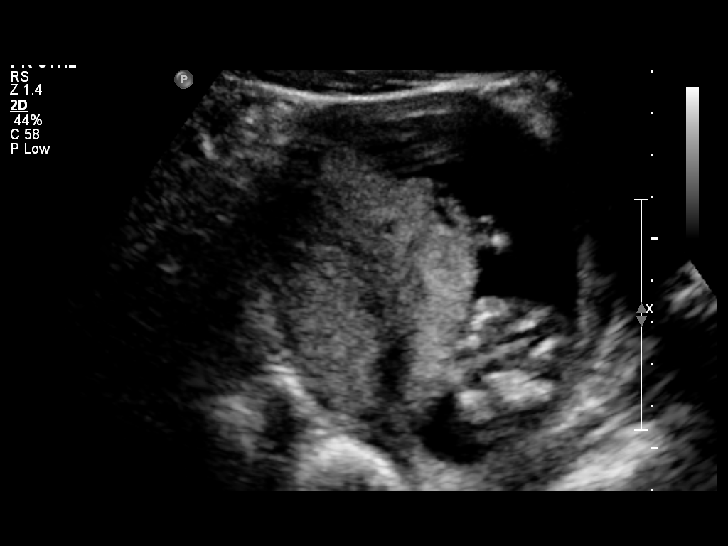
[im 15/21]
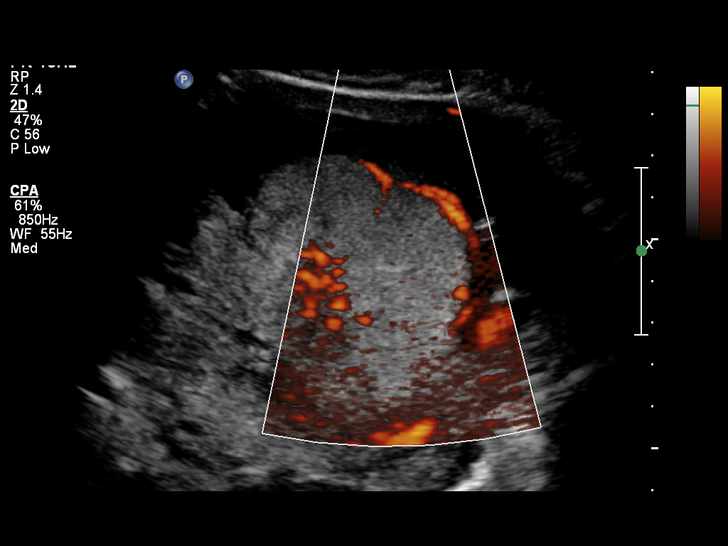
[im 17/21]
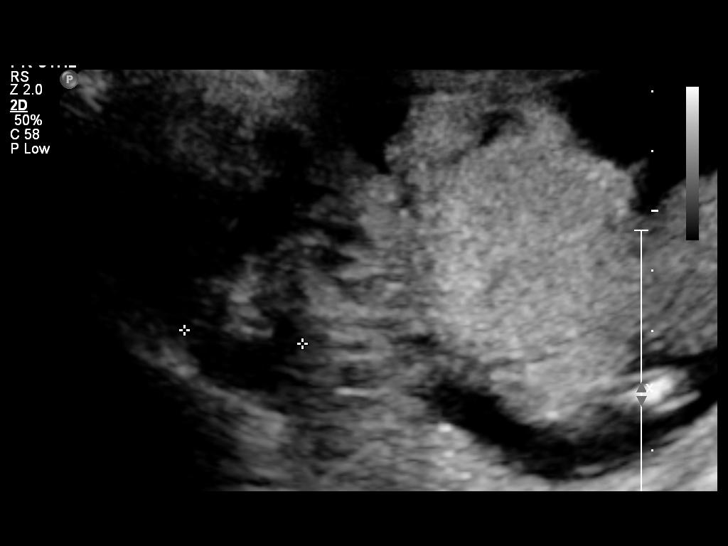
[im 18/21]
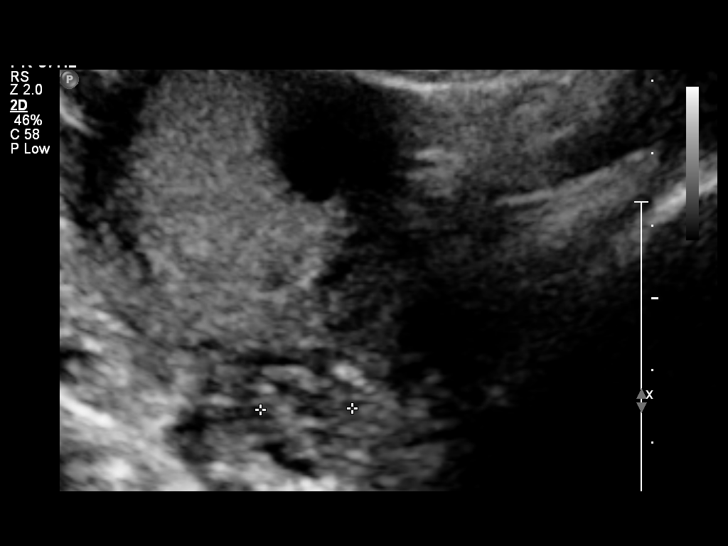
[im 20/21]
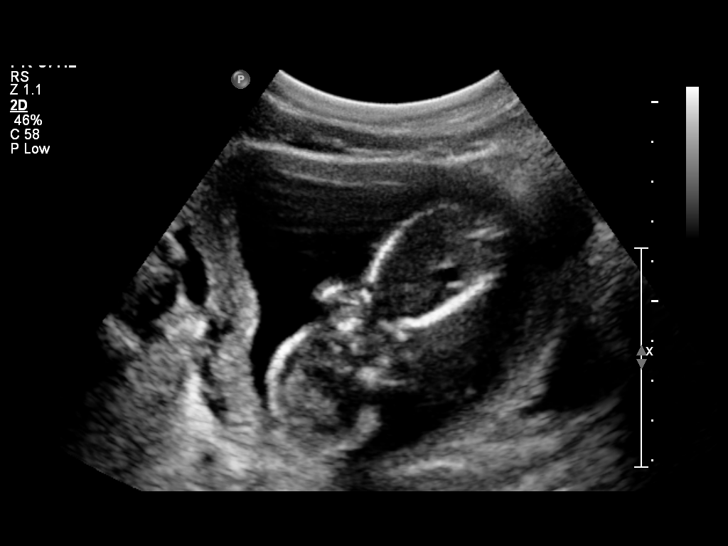

[Series 1: us ob comp less 14 wks · 1 of 1 slices shown (2 of 2)]
[im 1/1]
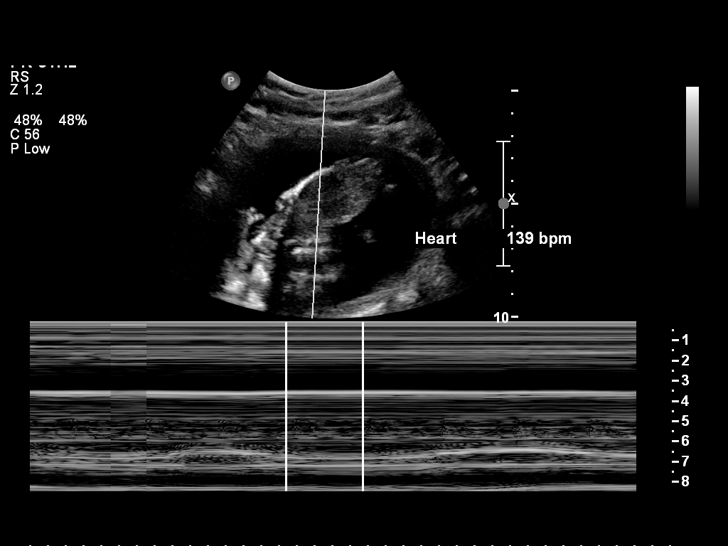

[13 of 22 positions shown; findings below may reference images not displayed]

OBSTETRICS REPORT
                      (Signed Final 06/05/2013 [DATE])

Service(s) Provided

 US OB COMP LESS 14 WKS                                76801.0
Indications

 Unsure of LMP;  Establish Gestational [AGE]
Fetal Evaluation

 Num Of Fetuses:    1
 Preg. Location:    Intrauterine
 Gest. Sac:         Intrauterine
 Yolk Sac:          Not visualized
 Fetal Pole:        Visualized
 Fetal Heart Rate:  139                         bpm
 Cardiac Activity:  Observed
 Presentation:      Variable
 Placenta:          Posterior
Biometry

 CRL:       81  mm    G. Age:   13w 5d                 EDD:   12/06/13
 BPD:     26.9  mm    G. Age:   14w 5d
Gestational Age

 LMP:           12w 4d       Date:   03/09/13                 EDD:   12/14/13
 U/S Today:     14w 5d                                        EDD:   11/29/13
 Best:          13w 5d    Det. By:   U/S C R L (06/05/13)     EDD:   12/06/13
Cervix Uterus Adnexa

 Cervix:       Closed
 Uterus:       No abnormality visualized.
 Cul De Sac:   No free fluid seen.
 Left Ovary:   Within normal limits measuring 1.9 x 1.5 x 1.3 cm.
 Right Ovary:  Within normal limits measuring 1.9 x 2.9 x 1.8.

 Adnexa:     Within normal limits.
Impression

 Single living intrauterine gestation demonstrating an EGA by
 CRL of 13w 5d. This is ahead of expected EGA by LMP of
 12w 4d and suggests best dating by today's exam. Normal
 ovaries.
Recommendations

## 2016-02-04 ENCOUNTER — Emergency Department (HOSPITAL_COMMUNITY)
Admission: EM | Admit: 2016-02-04 | Discharge: 2016-02-05 | Disposition: A | Discharge status: Home / Self Care | Payer: Medicaid Other | Attending: Emergency Medicine | Admitting: Emergency Medicine

## 2016-02-04 ENCOUNTER — Encounter (HOSPITAL_COMMUNITY): Payer: Self-pay | Admitting: *Deleted

## 2016-02-04 DIAGNOSIS — N898 Other specified noninflammatory disorders of vagina: Secondary | ICD-10-CM | POA: Insufficient documentation

## 2016-02-04 DIAGNOSIS — R102 Pelvic and perineal pain: Secondary | ICD-10-CM | POA: Diagnosis not present

## 2016-02-04 LAB — I-STAT BETA HCG BLOOD, ED (MC, WL, AP ONLY)

## 2016-02-04 NOTE — ED Notes (Signed)
Pt reports pelvic discomfort and vaginal discharge x 2 days. Denies urinary symptoms. Unsure of lmp.

## 2016-02-04 NOTE — ED Notes (Signed)
Pt stated she is just going to come back to be seen because she has to leave.

## 2016-03-17 ENCOUNTER — Encounter (HOSPITAL_COMMUNITY): Payer: Self-pay | Admitting: Emergency Medicine

## 2016-03-17 ENCOUNTER — Emergency Department (HOSPITAL_COMMUNITY)
Admission: EM | Admit: 2016-03-17 | Discharge: 2016-03-18 | Disposition: A | Discharge status: Home / Self Care | Payer: Medicaid Other | Attending: Emergency Medicine | Admitting: Emergency Medicine

## 2016-03-17 DIAGNOSIS — Z87891 Personal history of nicotine dependence: Secondary | ICD-10-CM | POA: Insufficient documentation

## 2016-03-17 DIAGNOSIS — N760 Acute vaginitis: Secondary | ICD-10-CM | POA: Diagnosis not present

## 2016-03-17 DIAGNOSIS — Z3202 Encounter for pregnancy test, result negative: Secondary | ICD-10-CM | POA: Insufficient documentation

## 2016-03-17 DIAGNOSIS — N898 Other specified noninflammatory disorders of vagina: Secondary | ICD-10-CM | POA: Diagnosis present

## 2016-03-17 DIAGNOSIS — B9689 Other specified bacterial agents as the cause of diseases classified elsewhere: Secondary | ICD-10-CM

## 2016-03-17 LAB — CBC
HEMATOCRIT: 34.3 % — AB (ref 36.0–46.0)
Hemoglobin: 11.6 g/dL — ABNORMAL LOW (ref 12.0–15.0)
MCH: 34.4 pg — AB (ref 26.0–34.0)
MCHC: 33.8 g/dL (ref 30.0–36.0)
MCV: 101.8 fL — AB (ref 78.0–100.0)
Platelets: 206 10*3/uL (ref 150–400)
RBC: 3.37 MIL/uL — AB (ref 3.87–5.11)
RDW: 11.9 % (ref 11.5–15.5)
WBC: 8.4 10*3/uL (ref 4.0–10.5)

## 2016-03-17 LAB — COMPREHENSIVE METABOLIC PANEL
ALT: 14 U/L (ref 14–54)
AST: 18 U/L (ref 15–41)
Albumin: 3.9 g/dL (ref 3.5–5.0)
Alkaline Phosphatase: 46 U/L (ref 38–126)
Anion gap: 10 (ref 5–15)
BUN: 10 mg/dL (ref 6–20)
CHLORIDE: 108 mmol/L (ref 101–111)
CO2: 25 mmol/L (ref 22–32)
Calcium: 9.6 mg/dL (ref 8.9–10.3)
Creatinine, Ser: 0.74 mg/dL (ref 0.44–1.00)
Glucose, Bld: 113 mg/dL — ABNORMAL HIGH (ref 65–99)
POTASSIUM: 3.5 mmol/L (ref 3.5–5.1)
SODIUM: 143 mmol/L (ref 135–145)
Total Bilirubin: 0.5 mg/dL (ref 0.3–1.2)
Total Protein: 7.2 g/dL (ref 6.5–8.1)

## 2016-03-17 LAB — URINALYSIS, ROUTINE W REFLEX MICROSCOPIC
Bilirubin Urine: NEGATIVE
GLUCOSE, UA: NEGATIVE mg/dL
HGB URINE DIPSTICK: NEGATIVE
Ketones, ur: NEGATIVE mg/dL
LEUKOCYTES UA: NEGATIVE
Nitrite: NEGATIVE
PH: 6 (ref 5.0–8.0)
Protein, ur: NEGATIVE mg/dL
Specific Gravity, Urine: 1.03 (ref 1.005–1.030)

## 2016-03-17 LAB — LIPASE, BLOOD: LIPASE: 21 U/L (ref 11–51)

## 2016-03-17 LAB — POC URINE PREG, ED: Preg Test, Ur: NEGATIVE

## 2016-03-17 NOTE — ED Notes (Signed)
C/o lower abd pain x 2 days.  Denies nausea, vomiting, diarrhea, or urinary complaint.  Reports pink vaginal discharge x 2-3 days that is now white.  LMP beginning of April- was light and only lasted 3 days.

## 2016-03-18 LAB — WET PREP, GENITAL
SPERM: NONE SEEN
Trich, Wet Prep: NONE SEEN
Yeast Wet Prep HPF POC: NONE SEEN

## 2016-03-18 LAB — GC/CHLAMYDIA PROBE AMP (~~LOC~~) NOT AT ARMC
Chlamydia: NEGATIVE
NEISSERIA GONORRHEA: NEGATIVE

## 2016-03-18 MED ORDER — ONDANSETRON 4 MG PO TBDP
8.0000 mg | ORAL_TABLET | Freq: Once | ORAL | Status: AC
  Administered 2016-03-18: 8 mg via ORAL
  Filled 2016-03-18: qty 2

## 2016-03-18 MED ORDER — METRONIDAZOLE 500 MG PO TABS
2000.0000 mg | ORAL_TABLET | Freq: Once | ORAL | Status: AC
  Administered 2016-03-18: 2000 mg via ORAL
  Filled 2016-03-18: qty 4

## 2016-03-18 NOTE — ED Notes (Signed)
Patient verbalized understanding of discharge instructions and denies any further needs or questions at this time. VS stable. Patient ambulatory with steady gait.  

## 2016-03-18 NOTE — ED Notes (Signed)
Patient not in room at this time - gown on bed, no belongings in sight. Will hold room a little while longer.

## 2016-03-18 NOTE — ED Provider Notes (Signed)
CSN: [id number]     Arrival date & time 03/17/16  2002 History   First MD Initiated Contact with Patient 03/17/16 2327     Chief Complaint  Patient presents with  . Abdominal Pain  . Vaginal Discharge     (Consider location/radiation/quality/duration/timing/severity/associated sxs/prior Treatment) HPI Comments: 30 year old [id number] female with a history of bacterial vaginosis presents to the emergency department for evaluation of vaginal discharge. Patient reports that she noticed a pink vaginal discharge 3 days ago. She states that she thought her menstrual cycle was coming on; however, the discharge changed to a white color and became malodorous. Patient states that she has had sporadic lower abdominal pains which are sharp. She denies modifying factors of the symptoms and has not taken any medication for her pain. She has no pain at present. Patient did recently discontinue Depo shots. She had a normal menses in both March and April 2017. No hx of abdominal surgeries. Patient is sexually active with her boyfriend of 11 years without the use of protection.  Patient is a 30 y.o. female presenting with abdominal pain and vaginal discharge. The history is provided by the patient. No language interpreter was used.  Abdominal Pain Associated symptoms: vaginal discharge   Associated symptoms: no diarrhea, no dysuria, no fever, no hematuria and no vomiting   Vaginal Discharge Associated symptoms: no abdominal pain, no dysuria, no fever and no vomiting     Past Medical History  Diagnosis Date  . No pertinent past medical history   . Abnormal Pap smear   . Bacterial vaginosis   . Medical history non-contributory    Past Surgical History  Procedure Laterality Date  . Cervix lesion destruction    . Wisdom tooth extraction     Family History  Problem Relation Age of Onset  . Cancer Maternal Grandmother    Social History  Substance Use Topics  . Smoking status: Former Smoker    Types:  Cigarettes    Quit date: 04/01/2012  . Smokeless tobacco: None  . Alcohol Use: Yes     Comment: occasionally   OB History    Gravida Para Term Preterm AB TAB SAB Ectopic Multiple Living   Review of Systems  Constitutional: Negative for fever.  Gastrointestinal: Negative for vomiting, abdominal pain, diarrhea and blood in stool.  Genitourinary: Positive for vaginal discharge. Negative for dysuria and hematuria.  All other systems reviewed and are negative.   Allergies  Review of patient's allergies indicates no known allergies.  Home Medications   Prior to Admission medications   Medication Sig Start Date End Date Taking? Authorizing Provider  metroNIDAZOLE (FLAGYL) 500 MG tablet Take 1 tablet (500 mg total) by mouth 2 (two) times daily. One po bid x 7 days Patient not taking: Reported on 03/18/2016 10/05/14   Richardean Canal, MD  oxyCODONE-acetaminophen (PERCOCET/ROXICET) 5-325 MG per tablet Take 1-2 tablets by mouth every 6 (six) hours as needed for severe pain. Patient not taking: Reported on 03/18/2016 02/12/14   Benjiman Core, MD  sulfamethoxazole-trimethoprim (BACTRIM DS) 800-160 MG per tablet Take 1 tablet by mouth 3 (three) times daily. Patient not taking: Reported on 03/18/2016 02/12/14   Benjiman Core, MD   BP 115/83 mmHg  Pulse 75  Temp(Src) 98.3 F (36.8 C) (Oral)  Resp 17  SpO2 100%  LMP 02/14/2016   Physical Exam  Constitutional: She is oriented to person, place, and time. She  appears well-developed and well-nourished. No distress.  Nontoxic/nonseptic appearing Patient text and on cell phone, in no distress.  HENT:  Head: Normocephalic and atraumatic.  Eyes: Conjunctivae and EOM are normal. No scleral icterus.  Neck: Normal range of motion.  Cardiovascular: Normal rate, regular rhythm and intact distal pulses.   Pulmonary/Chest: Effort normal. No respiratory distress. She has no wheezes.  Respirations even and unlabored  Abdominal: Soft.  She exhibits no distension. There is no tenderness. There is no rebound and no guarding.  Soft, nontender abdomen. No masses, rigidity, or peritoneal signs.  Genitourinary: There is no rash, tenderness, lesion or injury on the right labia. There is no rash, tenderness, lesion or injury on the left labia. Uterus is not tender. Cervix exhibits no motion tenderness and no friability. Right adnexum displays no mass, no tenderness and no fullness. Left adnexum displays no mass, no tenderness and no fullness. Vaginal discharge (small amount of thick, white discharge) found.  No cervical motion tenderness or adnexal tenderness  Musculoskeletal: Normal range of motion.  Neurological: She is alert and oriented to person, place, and time.  Skin: Skin is warm and dry. No rash noted. She is not diaphoretic. No erythema. No pallor.  Psychiatric: She has a normal mood and affect. Her behavior is normal.  Nursing note and vitals reviewed.   ED Course  Procedures (including critical care time) Labs Review Labs Reviewed  WET PREP, GENITAL - Abnormal; Notable for the following:    Clue Cells Wet Prep HPF POC PRESENT (*)    WBC, Wet Prep HPF POC MANY (*)    All other components within normal limits  COMPREHENSIVE METABOLIC PANEL - Abnormal; Notable for the following:    Glucose, Bld 113 (*)    All other components within normal limits  CBC - Abnormal; Notable for the following:    RBC 3.37 (*)    Hemoglobin 11.6 (*)    HCT 34.3 (*)    MCV 101.8 (*)    MCH 34.4 (*)    All other components within normal limits  LIPASE, BLOOD  URINALYSIS, ROUTINE W REFLEX MICROSCOPIC (NOT AT [facility])  POC URINE PREG, ED  GC/CHLAMYDIA PROBE AMP ([place]) NOT AT [facility]    Imaging Review No results found.   I have personally reviewed and evaluated these images and lab results as part of my medical decision-making.   EKG Interpretation None      12:57 AM  Went to discuss results with patient and found that she was  no longer in the room. Belongings gone. Gown on bed. Patient appears to have eloped from the department.  MDM   Final diagnoses:  Bacterial vaginosis    30 year old female presents to the emergency department for evaluation of vaginal discharge. Patient with a nontender abdominal exam and fairly noncontributory GU exam. Blood work and UA is noncontributory. Patient is afebrile and without leukocytosis. Mild amount of discharge noted. This is consistent with bacterial vaginosis given wet prep findings. GC/chlamydia pending. Plan to treat with Flagyl and discharge with OB/GYN follow-up. Return precautions given at discharge. Patient agreeable to plan with no unaddressed concerns; discharged in satisfactory condition.   Filed Vitals:   03/17/16 2023 03/17/16 2318 03/17/16 2328  BP: 119/75 143/100 115/83  Pulse: 78 84 75  Temp: 98.2 F (36.8 C) 98.1 F (36.7 C) 98.3 F (36.8 C)  TempSrc: Oral Oral Oral  Resp: 16 20 17   SpO2: 100% 100% 100%     Antony Madura, PA-C 03/18/16  [id number]  Geoffery Lyonsouglas Delo, MD 03/18/16 [phone]

## 2016-03-18 NOTE — Discharge Instructions (Signed)

## 2016-03-24 ENCOUNTER — Telehealth (HOSPITAL_BASED_OUTPATIENT_CLINIC_OR_DEPARTMENT_OTHER): Payer: Self-pay | Admitting: Emergency Medicine

## 2016-04-23 ENCOUNTER — Encounter (HOSPITAL_COMMUNITY): Payer: Self-pay | Admitting: Emergency Medicine

## 2016-04-23 ENCOUNTER — Emergency Department (HOSPITAL_COMMUNITY)
Admission: EM | Admit: 2016-04-23 | Discharge: 2016-04-23 | Disposition: A | Discharge status: Home / Self Care | Payer: Medicaid Other | Attending: Emergency Medicine | Admitting: Emergency Medicine

## 2016-04-23 DIAGNOSIS — Z79891 Long term (current) use of opiate analgesic: Secondary | ICD-10-CM | POA: Insufficient documentation

## 2016-04-23 DIAGNOSIS — R197 Diarrhea, unspecified: Secondary | ICD-10-CM | POA: Diagnosis not present

## 2016-04-23 DIAGNOSIS — R112 Nausea with vomiting, unspecified: Secondary | ICD-10-CM | POA: Insufficient documentation

## 2016-04-23 DIAGNOSIS — Z87891 Personal history of nicotine dependence: Secondary | ICD-10-CM | POA: Insufficient documentation

## 2016-04-23 DIAGNOSIS — Z792 Long term (current) use of antibiotics: Secondary | ICD-10-CM | POA: Diagnosis not present

## 2016-04-23 LAB — URINE MICROSCOPIC-ADD ON
BACTERIA UA: NONE SEEN
WBC UA: NONE SEEN WBC/hpf (ref 0–5)

## 2016-04-23 LAB — COMPREHENSIVE METABOLIC PANEL
ALT: 21 U/L (ref 14–54)
ANION GAP: 10 (ref 5–15)
AST: 30 U/L (ref 15–41)
Albumin: 4.3 g/dL (ref 3.5–5.0)
Alkaline Phosphatase: 48 U/L (ref 38–126)
BUN: 6 mg/dL (ref 6–20)
CHLORIDE: 108 mmol/L (ref 101–111)
CO2: 23 mmol/L (ref 22–32)
Calcium: 9.6 mg/dL (ref 8.9–10.3)
Creatinine, Ser: 0.65 mg/dL (ref 0.44–1.00)
GFR calc non Af Amer: >60 mL/min (ref >60)
Glucose, Bld: 94 mg/dL (ref 65–99)
POTASSIUM: 3.6 mmol/L (ref 3.5–5.1)
SODIUM: 141 mmol/L (ref 135–145)
Total Bilirubin: 0.5 mg/dL (ref 0.3–1.2)
Total Protein: 7.9 g/dL (ref 6.5–8.1)

## 2016-04-23 LAB — URINALYSIS, ROUTINE W REFLEX MICROSCOPIC
Bilirubin Urine: NEGATIVE
GLUCOSE, UA: NEGATIVE mg/dL
Ketones, ur: NEGATIVE mg/dL
LEUKOCYTES UA: NEGATIVE
Nitrite: NEGATIVE
PH: 8.5 — AB (ref 5.0–8.0)
PROTEIN: 30 mg/dL — AB
SPECIFIC GRAVITY, URINE: 1.023 (ref 1.005–1.030)

## 2016-04-23 LAB — CBC
HEMATOCRIT: 36.5 % (ref 36.0–46.0)
HEMOGLOBIN: 12.3 g/dL (ref 12.0–15.0)
MCH: 33.6 pg (ref 26.0–34.0)
MCHC: 33.7 g/dL (ref 30.0–36.0)
MCV: 99.7 fL (ref 78.0–100.0)
Platelets: 265 10*3/uL (ref 150–400)
RBC: 3.66 MIL/uL — AB (ref 3.87–5.11)
RDW: 11.5 % (ref 11.5–15.5)
WBC: 8.4 10*3/uL (ref 4.0–10.5)

## 2016-04-23 LAB — LIPASE, BLOOD: LIPASE: 21 U/L (ref 11–51)

## 2016-04-23 LAB — PREGNANCY, URINE: Preg Test, Ur: NEGATIVE

## 2016-04-23 LAB — ETHANOL: Alcohol, Ethyl (B): 16 mg/dL — ABNORMAL HIGH (ref <5)

## 2016-04-23 MED ORDER — ONDANSETRON 4 MG PO TBDP
4.0000 mg | ORAL_TABLET | Freq: Once | ORAL | Status: AC | PRN
  Administered 2016-04-23: 4 mg via ORAL

## 2016-04-23 MED ORDER — ONDANSETRON 4 MG PO TBDP
ORAL_TABLET | ORAL | Status: AC
  Filled 2016-04-23: qty 1

## 2016-04-23 MED ORDER — ONDANSETRON 4 MG PO TBDP: 4.0000 mg | ORAL_TABLET | Freq: Three times a day (TID) | ORAL | Status: DC | PRN

## 2016-04-23 NOTE — ED Provider Notes (Signed)
CSN: [id number]     Arrival date & time 04/23/16  1447 History   First MD Initiated Contact with Patient 04/23/16 1559     Chief Complaint  Patient presents with  . Emesis     (Consider location/radiation/quality/duration/timing/severity/associated sxs/prior Treatment) HPI Comments: Patient presents to the emergency department with chief complaint of nausea, vomiting, diarrhea. She states that she was out drinking last night, and began having nausea and vomiting this morning. She states that she did not drink heavily or even equal to what she normally drinks. She denies any associated fevers, chills, chest pain, or shortness of breath. She states that she has some left upper abdominal pain when she is vomiting. She was given Zofran in the waiting room, and states that she is feeling better. Denies any dysuria. There are no modifying factors.  The history is provided by the patient. No language interpreter was used.    Past Medical History  Diagnosis Date  . No pertinent past medical history   . Abnormal Pap smear   . Bacterial vaginosis   . Medical history non-contributory    Past Surgical History  Procedure Laterality Date  . Cervix lesion destruction    . Wisdom tooth extraction     Family History  Problem Relation Age of Onset  . Cancer Maternal Grandmother    Social History  Substance Use Topics  . Smoking status: Former Smoker    Types: Cigarettes    Quit date: 04/01/2012  . Smokeless tobacco: None  . Alcohol Use: Yes     Comment: occasionally   OB History    Gravida Para Term Preterm AB TAB SAB Ectopic Multiple Living   3 1 1  1  1   1      Review of Systems  Constitutional: Negative for fever and chills.  Respiratory: Negative for shortness of breath.   Cardiovascular: Negative for chest pain.  Gastrointestinal: Positive for nausea, vomiting and diarrhea. Negative for constipation.  Genitourinary: Negative for dysuria.  All other systems reviewed and are  negative.     Allergies  Review of patient's allergies indicates no known allergies.  Home Medications   Prior to Admission medications   Medication Sig Start Date End Date Taking? Authorizing Provider  metroNIDAZOLE (FLAGYL) 500 MG tablet Take 1 tablet (500 mg total) by mouth 2 (two) times daily. One po bid x 7 days Patient not taking: Reported on 03/18/2016 10/05/14   Richardean Canal, MD  oxyCODONE-acetaminophen (PERCOCET/ROXICET) 5-325 MG per tablet Take 1-2 tablets by mouth every 6 (six) hours as needed for severe pain. Patient not taking: Reported on 03/18/2016 02/12/14   Benjiman Core, MD  sulfamethoxazole-trimethoprim (BACTRIM DS) 800-160 MG per tablet Take 1 tablet by mouth 3 (three) times daily. Patient not taking: Reported on 03/18/2016 02/12/14   Benjiman Core, MD   BP 126/79 mmHg  Pulse 93  Temp(Src) 97.7 F (36.5 C) (Oral)  Resp 16  SpO2 100% Physical Exam  Constitutional: She is oriented to person, place, and time. She appears well-developed and well-nourished.  HENT:  Head: Normocephalic and atraumatic.  Eyes: Conjunctivae and EOM are normal. Pupils are equal, round, and reactive to light.  Neck: Normal range of motion. Neck supple.  Cardiovascular: Normal rate and regular rhythm.  Exam reveals no gallop and no friction rub.   No murmur heard. Pulmonary/Chest: Effort normal and breath sounds normal. No respiratory distress. She has no wheezes. She has no rales. She exhibits no tenderness.  Abdominal: Soft. Bowel sounds  are normal. She exhibits no distension and no mass. There is no tenderness. There is no rebound and no guarding.  No focal abdominal tenderness, no RLQ tenderness or pain at McBurney's point, no RUQ tenderness or Murphy's sign, no left-sided abdominal tenderness, no fluid wave, or signs of peritonitis   Musculoskeletal: Normal range of motion. She exhibits no edema or tenderness.  Neurological: She is alert and oriented to person, place, and time.  Skin:  Skin is warm and dry.  Psychiatric: She has a normal mood and affect. Her behavior is normal. Judgment and thought content normal.  Nursing note and vitals reviewed.   ED Course  Procedures (including critical care time) Results for orders placed or performed during the hospital encounter of 04/23/16  Lipase, blood  Result Value Ref Range   Lipase 21 11 - 51 U/L  Comprehensive metabolic panel  Result Value Ref Range   Sodium 141 135 - 145 mmol/L   Potassium 3.6 3.5 - 5.1 mmol/L   Chloride 108 101 - 111 mmol/L   CO2 23 22 - 32 mmol/L   Glucose, Bld 94 65 - 99 mg/dL   BUN 6 6 - 20 mg/dL   Creatinine, Ser 1.610.65 0.44 - 1.00 mg/dL   Calcium 9.6 8.9 - 09.610.3 mg/dL   Total Protein 7.9 6.5 - 8.1 g/dL   Albumin 4.3 3.5 - 5.0 g/dL   AST 30 15 - 41 U/L   ALT 21 14 - 54 U/L   Alkaline Phosphatase 48 38 - 126 U/L   Total Bilirubin 0.5 0.3 - 1.2 mg/dL   GFR calc non Af Amer >60 >60 mL/min   GFR calc Af Amer >60 >60 mL/min   Anion gap 10 5 - 15  CBC  Result Value Ref Range   WBC 8.4 4.0 - 10.5 K/uL   RBC 3.66 (L) 3.87 - 5.11 MIL/uL   Hemoglobin 12.3 12.0 - 15.0 g/dL   HCT 04.536.5 40.936.0 - 81.146.0 %   MCV 99.7 78.0 - 100.0 fL   MCH 33.6 26.0 - 34.0 pg   MCHC 33.7 30.0 - 36.0 g/dL   RDW 91.411.5 78.211.5 - 95.615.5 %   Platelets 265 150 - 400 K/uL  Urinalysis, Routine w reflex microscopic  Result Value Ref Range   Color, Urine YELLOW YELLOW   APPearance CLEAR CLEAR   Specific Gravity, Urine 1.023 1.005 - 1.030   pH 8.5 (H) 5.0 - 8.0   Glucose, UA NEGATIVE NEGATIVE mg/dL   Hgb urine dipstick MODERATE (A) NEGATIVE   Bilirubin Urine NEGATIVE NEGATIVE   Ketones, ur NEGATIVE NEGATIVE mg/dL   Protein, ur 30 (A) NEGATIVE mg/dL   Nitrite NEGATIVE NEGATIVE   Leukocytes, UA NEGATIVE NEGATIVE  Pregnancy, urine  Result Value Ref Range   Preg Test, Ur NEGATIVE NEGATIVE  Ethanol  Result Value Ref Range   Alcohol, Ethyl (B) 16 (H) <5 mg/dL  Urine microscopic-add on  Result Value Ref Range   Squamous  Epithelial / LPF 6-30 (A) NONE SEEN   WBC, UA NONE SEEN 0 - 5 WBC/hpf   RBC / HPF 0-5 0 - 5 RBC/hpf   Bacteria, UA NONE SEEN NONE SEEN   Urine-Other MUCOUS PRESENT    No results found.   I have personally reviewed and evaluated these images and lab results as part of my medical decision-making.    MDM   Final diagnoses:  Non-intractable vomiting with nausea, vomiting of unspecified type    Patient with nausea, vomiting, diarrhea. Symptoms started  early this morning. She has had good relief with Zofran.  Laboratory studies are reassuring, no leukocytosis, normal electrolytes, lipase is normal, doubt pancreatitis, urine pregnancy test negative. Patient has no focal abdominal pain on exam. Pending. Anticipate discharge to home with Zofran. Doubt alcohol poisoning given the patient drink less than she normally drinks and normal anion gap and well-appearing.  5:15 PM Patient reassessed, she states that she is feeling much better. She is tolerating oral intake. Focal abdominal tenderness. Return precautions given. Will give prescription for Zofran, and recommend primary care follow-up. Patient understands and agrees with the plan. She is stable and ready for discharge.  She is clinically sober.   Roxy Horseman, PA-C 04/24/16 0020  Mancel Bale, MD 04/24/16 [phone]

## 2016-04-23 NOTE — Discharge Instructions (Signed)

## 2016-04-23 NOTE — ED Notes (Addendum)
Went out last night drinking  Got home at 230 am and states started to vomit and  Tried to sleep and drink water but she is vomiting  Still feels like she has ETOH poisining

## 2016-06-21 ENCOUNTER — Encounter (HOSPITAL_COMMUNITY): Payer: Self-pay | Admitting: Emergency Medicine

## 2016-06-21 ENCOUNTER — Emergency Department (HOSPITAL_COMMUNITY)
Admission: EM | Admit: 2016-06-21 | Discharge: 2016-06-21 | Disposition: A | Discharge status: Home / Self Care | Payer: Medicaid Other | Attending: Dermatology | Admitting: Dermatology

## 2016-06-21 DIAGNOSIS — Z5321 Procedure and treatment not carried out due to patient leaving prior to being seen by health care provider: Secondary | ICD-10-CM | POA: Diagnosis not present

## 2016-06-21 DIAGNOSIS — Z87891 Personal history of nicotine dependence: Secondary | ICD-10-CM | POA: Insufficient documentation

## 2016-06-21 DIAGNOSIS — N898 Other specified noninflammatory disorders of vagina: Secondary | ICD-10-CM | POA: Diagnosis not present

## 2016-06-21 LAB — URINALYSIS, ROUTINE W REFLEX MICROSCOPIC
BILIRUBIN URINE: NEGATIVE
GLUCOSE, UA: NEGATIVE mg/dL
HGB URINE DIPSTICK: NEGATIVE
KETONES UR: NEGATIVE mg/dL
Leukocytes, UA: NEGATIVE
Nitrite: NEGATIVE
PH: 7 (ref 5.0–8.0)
PROTEIN: NEGATIVE mg/dL
Specific Gravity, Urine: 1.018 (ref 1.005–1.030)

## 2016-06-21 LAB — POC URINE PREG, ED: Preg Test, Ur: NEGATIVE

## 2016-06-21 NOTE — ED Triage Notes (Signed)
Pt. reports malodorous vaginal discharge onset 3 days ago , denies dysuria or fever .

## 2016-06-21 NOTE — ED Notes (Signed)
Pt comes to desk stating that she will return another time to be checked out. Pt encouraged to stay and be seen by a doctor, however patient insists that she will not wait. No acute distress at this time. Pt ambulatory to car.

## 2016-07-02 ENCOUNTER — Encounter (HOSPITAL_COMMUNITY): Payer: Self-pay | Admitting: *Deleted

## 2016-07-02 ENCOUNTER — Emergency Department (HOSPITAL_COMMUNITY)
Admission: EM | Admit: 2016-07-02 | Discharge: 2016-07-03 | Disposition: A | Discharge status: Home / Self Care | Payer: Medicaid Other | Attending: Emergency Medicine | Admitting: Emergency Medicine

## 2016-07-02 DIAGNOSIS — N898 Other specified noninflammatory disorders of vagina: Secondary | ICD-10-CM | POA: Diagnosis present

## 2016-07-02 DIAGNOSIS — B9689 Other specified bacterial agents as the cause of diseases classified elsewhere: Secondary | ICD-10-CM | POA: Diagnosis not present

## 2016-07-02 DIAGNOSIS — Z711 Person with feared health complaint in whom no diagnosis is made: Secondary | ICD-10-CM

## 2016-07-02 DIAGNOSIS — N760 Acute vaginitis: Secondary | ICD-10-CM | POA: Insufficient documentation

## 2016-07-02 DIAGNOSIS — Z87891 Personal history of nicotine dependence: Secondary | ICD-10-CM | POA: Insufficient documentation

## 2016-07-02 LAB — URINALYSIS, ROUTINE W REFLEX MICROSCOPIC
BILIRUBIN URINE: NEGATIVE
GLUCOSE, UA: NEGATIVE mg/dL
Hgb urine dipstick: NEGATIVE
KETONES UR: NEGATIVE mg/dL
LEUKOCYTES UA: NEGATIVE
Nitrite: NEGATIVE
PROTEIN: NEGATIVE mg/dL
Specific Gravity, Urine: 1.022 (ref 1.005–1.030)
pH: 7.5 (ref 5.0–8.0)

## 2016-07-02 LAB — CBC WITH DIFFERENTIAL/PLATELET
BASOS ABS: 0 10*3/uL (ref 0.0–0.1)
BASOS PCT: 0 %
EOS PCT: 0 %
Eosinophils Absolute: 0 10*3/uL (ref 0.0–0.7)
HCT: 35 % — ABNORMAL LOW (ref 36.0–46.0)
Hemoglobin: 11.5 g/dL — ABNORMAL LOW (ref 12.0–15.0)
LYMPHS PCT: 30 %
Lymphs Abs: 2.3 10*3/uL (ref 0.7–4.0)
MCH: 34 pg (ref 26.0–34.0)
MCHC: 32.9 g/dL (ref 30.0–36.0)
MCV: 103.6 fL — AB (ref 78.0–100.0)
MONO ABS: 0.3 10*3/uL (ref 0.1–1.0)
Monocytes Relative: 4 %
Neutro Abs: 5.1 10*3/uL (ref 1.7–7.7)
Neutrophils Relative %: 66 %
PLATELETS: 212 10*3/uL (ref 150–400)
RBC: 3.38 MIL/uL — ABNORMAL LOW (ref 3.87–5.11)
RDW: 12.2 % (ref 11.5–15.5)
WBC: 7.7 10*3/uL (ref 4.0–10.5)

## 2016-07-02 LAB — WET PREP, GENITAL
Sperm: NONE SEEN
Trich, Wet Prep: NONE SEEN
Yeast Wet Prep HPF POC: NONE SEEN

## 2016-07-02 LAB — I-STAT BETA HCG BLOOD, ED (MC, WL, AP ONLY): I-stat hCG, quantitative: <5 m[IU]/mL (ref <5)

## 2016-07-02 NOTE — ED Notes (Signed)
Pt came to nurse first and told Marchelle Folksmanda, EMT that she has been sitting in her car and wanted to know if her name has been called.  Pt informed she has been called and to remain in waiting room because she should be called again soon.

## 2016-07-02 NOTE — ED Notes (Signed)
Called pt x2 to recheck vital signs with no answer.

## 2016-07-02 NOTE — ED Provider Notes (Signed)
[facility] Provider Note   CSN: [id number] Arrival date & time: 07/02/16  1709     History   Chief Complaint Chief Complaint  Patient presents with  . Vaginal Discharge    HPI Megan Nolan is a 30 y.o. female.  HPI   Patient is a 30 year old female who presents the emergency department with 3 days of white vaginal discharge. She states the vaginal discharge has a foul odor. Is not itchy. She has had something similar to this in the past. She denies abdominal pain, abnormal vaginal bleeding, fevers, chills, nausea, vomiting. Patient has one female sexual partner. She does not use protection.  Past Medical History:  Diagnosis Date  . Abnormal Pap smear   . Bacterial vaginosis   . Medical history non-contributory   . No pertinent past medical history     There are no active problems to display for this patient.   Past Surgical History:  Procedure Laterality Date  . CERVIX LESION DESTRUCTION    . WISDOM TOOTH EXTRACTION      OB History    Gravida Para Term Preterm AB Living   3 1 1   1 1    SAB TAB Ectopic Multiple Live Births   1       1       Home Medications    Prior to Admission medications   Medication Sig Start Date End Date Taking? Authorizing Provider  metroNIDAZOLE (FLAGYL) 500 MG tablet Take 1 tablet (500 mg total) by mouth 2 (two) times daily. 07/03/16   Karizma Cheek L Salvador Coupe, PA  ondansetron (ZOFRAN ODT) 4 MG disintegrating tablet Take 1 tablet (4 mg total) by mouth every 8 (eight) hours as needed for nausea or vomiting. 04/23/16   Roxy Horsemanobert Browning, PA-C  oxyCODONE-acetaminophen (PERCOCET/ROXICET) 5-325 MG per tablet Take 1-2 tablets by mouth every 6 (six) hours as needed for severe pain. Patient not taking: Reported on 03/18/2016 02/12/14   Benjiman CoreNathan Pickering, MD  sulfamethoxazole-trimethoprim (BACTRIM DS) 800-160 MG per tablet Take 1 tablet by mouth 3 (three) times daily. Patient not taking: Reported on 03/18/2016 02/12/14   Benjiman CoreNathan Pickering, MD     Family History Family History  Problem Relation Age of Onset  . Cancer Maternal Grandmother     Social History Social History  Substance Use Topics  . Smoking status: Former Smoker    Types: Cigarettes    Quit date: 04/01/2012  . Smokeless tobacco: Never Used  . Alcohol use Yes     Comment: occasionally     Allergies   Review of patient's allergies indicates no known allergies.   Review of Systems Review of Systems  Constitutional: Negative for chills and fever.  Gastrointestinal: Negative for abdominal pain, nausea and vomiting.  Genitourinary: Positive for vaginal discharge. Negative for dysuria, flank pain, hematuria, vaginal bleeding and vaginal pain.  Skin: Negative for rash.  Neurological: Negative for headaches.     Physical Exam Updated Vital Signs BP 129/78   Pulse 95   Temp 98.9 F (37.2 C)   Resp 16   Ht 5' 6.5" (1.689 m)   Wt 61.8 kg   LMP 06/14/2016 (Approximate)   SpO2 98%   BMI 21.65 kg/m   Physical Exam  Constitutional: She appears well-developed and well-nourished. No distress.  HENT:  Head: Normocephalic and atraumatic.  Eyes: Conjunctivae are normal.  Neck: Normal range of motion.  Cardiovascular: Normal rate, regular rhythm and normal heart sounds.  Exam reveals no gallop and no friction rub.  No murmur heard. Pulmonary/Chest: Effort normal and breath sounds normal. No respiratory distress. She has no wheezes. She has no rales.  Abdominal: Soft. Bowel sounds are normal. She exhibits no distension and no mass. There is no tenderness. There is no guarding and no CVA tenderness.  Genitourinary:  Genitourinary Comments: Exam performed by Jerre Simon,  exam chaperoned Date: 07/02/2016 Pelvic exam: normal external genitalia without evidence of trauma. VULVA: normal appearing vulva with no masses, tenderness or lesion. VAGINA: normal appearing vagina with normal color and discharge, no lesions. CERVIX: normal appearing cervix  without lesions, cervical motion tenderness absent, cervical os closed with out purulent discharge; vaginal discharge - white, copious, creamy, malodorous and thin, Wet prep and DNA probe for chlamydia and GC obtained.   ADNEXA: normal adnexa in size, nontender and no masses UTERUS: uterus is normal size, shape, consistency and nontender.    Musculoskeletal: Normal range of motion.  Neurological: She is alert. Coordination normal.  Skin: Skin is warm and dry. She is not diaphoretic.  Psychiatric: She has a normal mood and affect. Her behavior is normal.  Nursing note and vitals reviewed.    ED Treatments / Results  Labs (all labs ordered are listed, but only abnormal results are displayed) Labs Reviewed  WET PREP, GENITAL - Abnormal; Notable for the following:       Result Value   Clue Cells Wet Prep HPF POC PRESENT (*)    WBC, Wet Prep HPF POC FEW (*)    All other components within normal limits  CBC WITH DIFFERENTIAL/PLATELET - Abnormal; Notable for the following:    RBC 3.38 (*)    Hemoglobin 11.5 (*)    HCT 35.0 (*)    MCV 103.6 (*)    All other components within normal limits  URINALYSIS, ROUTINE W REFLEX MICROSCOPIC (NOT AT [facility]) - Abnormal; Notable for the following:    APPearance CLOUDY (*)    All other components within normal limits  RPR  HIV ANTIBODY (ROUTINE TESTING)  I-STAT BETA HCG BLOOD, ED ([facility], [facility], [facility] ONLY)  GC/CHLAMYDIA PROBE AMP () NOT AT [facility]    EKG  EKG Interpretation None       Radiology No results found.  Procedures Procedures (including critical care time)  Medications Ordered in ED Medications  cefTRIAXone (ROCEPHIN) injection 250 mg (not administered)  azithromycin (ZITHROMAX) powder 1 g (not administered)  sterile water (preservative free) injection (not administered)     Initial Impression / Assessment and Plan / ED Course  I have reviewed the triage vital signs and the nursing notes.  Pertinent labs & imaging results  that were available during my care of the patient were reviewed by me and considered in my medical decision making (see chart for details).  Clinical Course   Patient treated in the ED for STI with Rocephin and azithromycin. Patient advised to inform and treat all sexual partners.  Pt advised on safe sex practices and understands that they have GC/Chlamydia cultures pending and will result in 2-3 days. HIV and RPR sent. Pt encouraged to follow up at local health department for future STI checks. No concern for PID. Discussed return precautions. Pt appears safe for discharge.    Final Clinical Impressions(s) / ED Diagnoses   Final diagnoses:  BV (bacterial vaginosis)  Vaginal discharge  Concern about STD in female without diagnosis    New Prescriptions Current Discharge Medication List       Jerre Simon, [place] 07/03/16 0009  Bethann BerkshireJoseph Zammit, MD 07/03/16 [phone]

## 2016-07-02 NOTE — ED Triage Notes (Signed)
The pt is c/o a vaginal discharge for one week  With an odor  lmp  july

## 2016-07-03 LAB — RPR: RPR Ser Ql: NONREACTIVE

## 2016-07-03 LAB — HIV ANTIBODY (ROUTINE TESTING W REFLEX): HIV SCREEN 4TH GENERATION: NONREACTIVE

## 2016-07-03 MED ORDER — METRONIDAZOLE 500 MG PO TABS: 500.0000 mg | ORAL_TABLET | Freq: Two times a day (BID) | ORAL | 0 refills | Status: DC

## 2016-07-03 MED ORDER — CEFTRIAXONE SODIUM 250 MG IJ SOLR
250.0000 mg | Freq: Once | INTRAMUSCULAR | Status: AC
  Administered 2016-07-03: 250 mg via INTRAMUSCULAR
  Filled 2016-07-03 (×2): qty 250

## 2016-07-03 MED ORDER — STERILE WATER FOR INJECTION IJ SOLN
INTRAMUSCULAR | Status: AC
  Administered 2016-07-03: 1 mL
  Filled 2016-07-03: qty 10

## 2016-07-03 MED ORDER — AZITHROMYCIN 1 G PO PACK
1.0000 g | PACK | Freq: Once | ORAL | Status: AC
  Administered 2016-07-03: 1 g via ORAL
  Filled 2016-07-03: qty 1

## 2016-07-03 NOTE — Discharge Instructions (Signed)
Take the Flagyl as prescribed. Do not drink alcohol while taking this medication. Be sure to complete the entire course. Use condoms when having sex to prevent STDs. Inform sexual partner(s) that you were treated for gonorrhea and chlamydia. Your gonorrhea and chlamydia cultures are still pending. Your HIV and syphilis tests are also pending. [facility] if your symptoms not improve and for future STD testing.  Return to the emergency department if you experience abdominal pain, nausea, vomiting, fevers or any other concerning symptoms.

## 2016-07-03 NOTE — ED Notes (Signed)
Pt verbalized understanding of discharge instructions and follow-up care. Denies further questions at this time. 

## 2016-07-05 LAB — GC/CHLAMYDIA PROBE AMP (~~LOC~~) NOT AT ARMC
Chlamydia: NEGATIVE
Neisseria Gonorrhea: NEGATIVE

## 2017-01-26 ENCOUNTER — Ambulatory Visit (HOSPITAL_COMMUNITY)
Admission: EM | Admit: 2017-01-26 | Discharge: 2017-01-26 | Disposition: A | Discharge status: Home / Self Care | Payer: Medicaid Other | Attending: Family Medicine | Admitting: Family Medicine

## 2017-01-26 ENCOUNTER — Encounter (HOSPITAL_COMMUNITY): Payer: Self-pay | Admitting: Family Medicine

## 2017-01-26 NOTE — ED Triage Notes (Signed)
Pt here for vaginal discharge and odor 

## 2017-01-26 NOTE — ED Notes (Signed)
Patient reports she is unable to wait any longer.  Notified Hayden Rasmussenavid Mabe, NP that patient leaving.

## 2017-02-19 ENCOUNTER — Ambulatory Visit (HOSPITAL_COMMUNITY)
Admission: EM | Admit: 2017-02-19 | Discharge: 2017-02-19 | Disposition: A | Discharge status: Home / Self Care | Payer: Medicaid Other | Attending: Internal Medicine | Admitting: Internal Medicine

## 2017-02-19 ENCOUNTER — Encounter (HOSPITAL_COMMUNITY): Payer: Self-pay | Admitting: Family Medicine

## 2017-02-19 DIAGNOSIS — N760 Acute vaginitis: Secondary | ICD-10-CM

## 2017-02-19 DIAGNOSIS — B9689 Other specified bacterial agents as the cause of diseases classified elsewhere: Secondary | ICD-10-CM

## 2017-02-19 MED ORDER — METRONIDAZOLE 500 MG PO TABS: 500.0000 mg | ORAL_TABLET | Freq: Two times a day (BID) | ORAL | 0 refills | Status: DC

## 2017-02-19 MED ORDER — FLUCONAZOLE 200 MG PO TABS: ORAL_TABLET | ORAL | 0 refills | Status: DC

## 2017-02-19 NOTE — ED Triage Notes (Signed)
Pt here for vaginal discharge and odor. Hx of BV.

## 2017-02-19 NOTE — Discharge Instructions (Signed)
I'm treating you tonight for bacterial vaginosis, I prescribed metronidazole, take 1 tablet by mouth twice a day for 7 days. Do not drink any alcohol while taking this medicine as it'll make you very ill. To cover for the possibility of yeast, I have also prescribed Diflucan, take 1 tablet now, wait 3 days, and take a second tablet. Your urine is being sent for testing for gonorrhea, chlamydia, and trichomoniasis as well as for the bacteria to cause BV, and yeast candidiasis. If you are positive for anything you'll be called in 3-5 business days, given instructions as to follow-up care.

## 2017-02-19 NOTE — ED Provider Notes (Signed)
CSN: [id number]     Arrival date & time 02/19/17  1957 History   First MD Initiated Contact with Patient 02/19/17 2121     Chief Complaint  Patient presents with  . Vaginal Discharge   (Consider location/radiation/quality/duration/timing/severity/associated sxs/prior Treatment) 31 year old female presents for evaluation of vaginal discharge ongoing for approximately one week. She states she has past history of BV, and that her current symptoms are consistent with prior infections. She does not believe that this is an STI or STD, and has no new partners.   The history is provided by the patient.  Vaginal Discharge  Quality:  Milky, thin and watery ("chunky") Severity:  Mild Onset quality:  Gradual Duration:  1 week Timing:  Constant Progression:  Unchanged Chronicity:  Recurrent Context: after urination   Relieved by:  None tried Worsened by:  Nothing Ineffective treatments:  None tried Associated symptoms: no abdominal pain, no dyspareunia, no dysuria, no fever, no genital lesions, no nausea, no urinary frequency, no vaginal itching and no vomiting   Risk factors: no new sexual partner, no STI, no STI exposure and no unprotected sex     Past Medical History:  Diagnosis Date  . Abnormal Pap smear   . Bacterial vaginosis   . Medical history non-contributory   . No pertinent past medical history    Past Surgical History:  Procedure Laterality Date  . CERVIX LESION DESTRUCTION    . WISDOM TOOTH EXTRACTION     Family History  Problem Relation Age of Onset  . Cancer Maternal Grandmother    Social History  Substance Use Topics  . Smoking status: Former Smoker    Types: Cigarettes    Quit date: 04/01/2012  . Smokeless tobacco: Never Used  . Alcohol use Yes     Comment: occasionally   OB History    Gravida Para Term Preterm AB Living   SAB TAB Ectopic Multiple Live Births   1       1     Review of Systems  Constitutional: Negative for chills and fever.   Respiratory: Negative for shortness of breath and wheezing.   Cardiovascular: Negative for chest pain and palpitations.  Gastrointestinal: Negative for abdominal pain, nausea and vomiting.  Genitourinary: Positive for vaginal discharge. Negative for dyspareunia and dysuria.  Musculoskeletal: Negative for back pain, neck pain and neck stiffness.  Skin: Negative for color change and rash.  Neurological: Negative for dizziness and weakness.  Hematological: Negative for adenopathy.    Allergies  Patient has no known allergies.  Home Medications   Prior to Admission medications   Medication Sig Start Date End Date Taking? Authorizing Provider  fluconazole (DIFLUCAN) 200 MG tablet Take 1 tablet now, than wait 3 days and take the second tablet 02/19/17   Dorena Bodo, NP  metroNIDAZOLE (FLAGYL) 500 MG tablet Take 1 tablet (500 mg total) by mouth 2 (two) times daily. 02/19/17   Dorena Bodo, NP   Meds Ordered and Administered this Visit  Medications - No data to display  BP 126/62   Pulse 97   Temp 98.5 F (36.9 C)   Resp 18   LMP 02/05/2017   SpO2 99%  No data found.   Physical Exam  Constitutional: She is oriented to person, place, and time. She appears well-developed and well-nourished. No distress.  HENT:  Head: Normocephalic and atraumatic.  Right Ear: External ear normal.  Left Ear: External ear normal.  Eyes: Conjunctivae are  normal. Right eye exhibits no discharge. Left eye exhibits no discharge.  Cardiovascular: Normal rate and regular rhythm.   Pulmonary/Chest: Effort normal and breath sounds normal.  Abdominal: Soft. Bowel sounds are normal. She exhibits no distension. There is no tenderness. There is no guarding.  Genitourinary:  Genitourinary Comments: Deferred at patient's request, urine cytology collected  Neurological: She is alert and oriented to person, place, and time.  Skin: Skin is warm and dry. Capillary refill takes less than 2 seconds. She is not  diaphoretic.  Psychiatric: She has a normal mood and affect. Her behavior is normal.  Nursing note and vitals reviewed.   Urgent Care Course     Procedures (including critical care time)  Labs Review Labs Reviewed  URINE CYTOLOGY ANCILLARY ONLY    Imaging Review No results found.    MDM   1. BV (bacterial vaginosis)     Treating presumptively for bacterial vaginosis, and also covering for yeast candidiasis. Consideration was given for possible STDs, discussion was had with patient concerning treatment. Patient does not currently want this treatment, prefers to wait for results of urine cytology. We'll notify the patient 3-5 business days if she is positive. Encouraged follow-up with primary care, return to clinic, or follow-up with Public health if symptoms persist.     Dorena Bodo, NP 02/19/17 2147

## 2017-02-19 NOTE — ED Notes (Signed)
Verified phone number 

## 2017-02-21 LAB — URINE CYTOLOGY ANCILLARY ONLY
Chlamydia: NEGATIVE
Neisseria Gonorrhea: NEGATIVE
TRICH (WINDOWPATH): NEGATIVE

## 2017-02-23 LAB — URINE CYTOLOGY ANCILLARY ONLY: CANDIDA VAGINITIS: NEGATIVE

## 2017-06-28 ENCOUNTER — Encounter (HOSPITAL_COMMUNITY): Payer: Self-pay | Admitting: Physician Assistant

## 2017-06-28 ENCOUNTER — Ambulatory Visit (HOSPITAL_COMMUNITY)
Admission: EM | Admit: 2017-06-28 | Discharge: 2017-06-28 | Disposition: A | Discharge status: Home / Self Care | Payer: Self-pay | Attending: Physician Assistant | Admitting: Physician Assistant

## 2017-06-28 DIAGNOSIS — N898 Other specified noninflammatory disorders of vagina: Secondary | ICD-10-CM | POA: Insufficient documentation

## 2017-06-28 DIAGNOSIS — Z87891 Personal history of nicotine dependence: Secondary | ICD-10-CM | POA: Insufficient documentation

## 2017-06-28 MED ORDER — STERILE WATER FOR INJECTION IJ SOLN
INTRAMUSCULAR | Status: AC
  Filled 2017-06-28: qty 10

## 2017-06-28 MED ORDER — CEFTRIAXONE SODIUM 250 MG IJ SOLR
250.0000 mg | Freq: Once | INTRAMUSCULAR | Status: AC
  Administered 2017-06-28: 250 mg via INTRAMUSCULAR

## 2017-06-28 MED ORDER — FLUCONAZOLE 150 MG PO TABS: 150.0000 mg | ORAL_TABLET | Freq: Every day | ORAL | 0 refills | Status: DC

## 2017-06-28 MED ORDER — AZITHROMYCIN 250 MG PO TABS
1000.0000 mg | ORAL_TABLET | Freq: Once | ORAL | Status: AC
  Administered 2017-06-28: 1000 mg via ORAL

## 2017-06-28 MED ORDER — AZITHROMYCIN 250 MG PO TABS
ORAL_TABLET | ORAL | Status: AC
  Filled 2017-06-28: qty 4

## 2017-06-28 MED ORDER — METRONIDAZOLE 500 MG PO TABS: 500.0000 mg | ORAL_TABLET | Freq: Two times a day (BID) | ORAL | 0 refills | Status: DC

## 2017-06-28 MED ORDER — CEFTRIAXONE SODIUM 250 MG IJ SOLR
INTRAMUSCULAR | Status: AC
  Filled 2017-06-28: qty 250

## 2017-06-28 NOTE — ED Provider Notes (Signed)
[facility]    CSN: [id number] Arrival date & time: 06/28/17  1508     History   Chief Complaint Chief Complaint  Patient presents with  . Vaginitis    HPI Megan Nolan is a 31 y.o. female.   31 year old female comes in for 3 day history of vaginal discomfort. States she has a new sexual partner, condom broke during intercourse, would like to be treated for "all STDs". States she has mild discharge. Denies spotting, urinary symptoms such as frequency, dysuria, hematuria. Denies abdominal pain, nausea, vomiting, diarrhea, constipation. Denies fevers, chills, night sweats. LMP 06/06/2017, no other birth control used, would like to defer pregnancy testing today.           Past Medical History:  Diagnosis Date  . Abnormal Pap smear   . Bacterial vaginosis   . Medical history non-contributory   . No pertinent past medical history     There are no active problems to display for this patient.   Past Surgical History:  Procedure Laterality Date  . CERVIX LESION DESTRUCTION    . WISDOM TOOTH EXTRACTION      OB History    Gravida Para Term Preterm AB Living   3 1 1   1 1    SAB TAB Ectopic Multiple Live Births   1       1       Home Medications    Prior to Admission medications   Medication Sig Start Date End Date Taking? Authorizing Provider  fluconazole (DIFLUCAN) 150 MG tablet Take 1 tablet (150 mg total) by mouth daily. Take second dose 72 hours later if symptoms still persists. 06/28/17   Cathie HoopsYu, Jonie Burdell V, PA-C  metroNIDAZOLE (FLAGYL) 500 MG tablet Take 1 tablet (500 mg total) by mouth 2 (two) times daily. 06/28/17   Belinda FisherYu, Cammie Faulstich V, PA-C    Family History Family History  Problem Relation Age of Onset  . Cancer Maternal Grandmother     Social History Social History  Substance Use Topics  . Smoking status: Former Smoker    Types: Cigarettes    Quit date: 04/01/2012  . Smokeless tobacco: Never Used  . Alcohol use Yes     Comment: occasionally      Allergies   Patient has no known allergies.   Review of Systems Review of Systems  Reason unable to perform ROS: See HPI as above.     Physical Exam Triage Vital Signs ED Triage Vitals [06/28/17 1528]  Enc Vitals Group     BP 119/75     Pulse Rate 74     Resp 16     Temp 98.5 F (36.9 C)     Temp Source Oral     SpO2 100 %     Weight      Height      Head Circumference      Peak Flow      Pain Score      Pain Loc      Pain Edu?      Excl. in GC?    No data found.   Updated Vital Signs BP 119/75 (BP Location: Left Arm)   Pulse 74   Temp 98.5 F (36.9 C) (Oral)   Resp 16   SpO2 100%    Physical Exam  Constitutional: She is oriented to person, place, and time. She appears well-developed and well-nourished. No distress.  HENT:  Head: Normocephalic and atraumatic.  Pulmonary/Chest: Effort normal. No respiratory distress.  Genitourinary:  Genitourinary Comments: Deferred due to patient's preference. Cytology obtained by patient.   Neurological: She is alert and oriented to person, place, and time.  Skin: Skin is warm and dry.     UC Treatments / Results  Labs (all labs ordered are listed, but only abnormal results are displayed) Labs Reviewed  CERVICOVAGINAL ANCILLARY ONLY    EKG  EKG Interpretation None       Radiology No results found.  Procedures Procedures (including critical care time)  Medications Ordered in UC Medications  azithromycin (ZITHROMAX) tablet 1,000 mg (1,000 mg Oral Given 06/28/17 1650)  cefTRIAXone (ROCEPHIN) injection 250 mg (250 mg Intramuscular Given 06/28/17 1651)     Initial Impression / Assessment and Plan / UC Course  I have reviewed the triage vital signs and the nursing notes.  Pertinent labs & imaging results that were available during my care of the patient were reviewed by me and considered in my medical decision making (see chart for details).    Patient will like to be treated empirically for "all  STDs". Azithromycin and Rocephin given in office. Patient to start Flagyl and Diflucan for Trichomonas, BV, yeast. Cytology sent, patient will be contacted with any positive results. Discussed with patient testing does not include syphilis, HIV, patient expresses understanding and will seek additional testing needed at the health Department. Patient deferred urine dipstick and pregnancy testing today. Return precautions given.  Final Clinical Impressions(s) / UC Diagnoses   Final diagnoses:  Vaginal discharge    New Prescriptions Discharge Medication List as of 06/28/2017  4:11 PM        Belinda Fisher, PA-C 06/28/17 1754

## 2017-06-28 NOTE — Discharge Instructions (Signed)
Per your preference, you are treated empirically for gonorrhea, chlamydia, Trichomonas, bacterial vaginosis, yeast infection. Testing sent, you will be contacted with any positive results. Refrain from sexual activity and alcohol use for the next 7 days. Monitor for worsening of symptoms, abdominal pain, nausea, vomiting, fever, follow-up for reevaluation.

## 2017-06-30 LAB — CERVICOVAGINAL ANCILLARY ONLY
Bacterial vaginitis: POSITIVE — AB
CHLAMYDIA, DNA PROBE: NEGATIVE
Candida vaginitis: NEGATIVE
Neisseria Gonorrhea: NEGATIVE
Trichomonas: NEGATIVE

## 2017-07-06 ENCOUNTER — Encounter (HOSPITAL_COMMUNITY): Payer: Self-pay | Admitting: Emergency Medicine

## 2017-07-06 ENCOUNTER — Emergency Department (HOSPITAL_COMMUNITY)
Admission: EM | Admit: 2017-07-06 | Discharge: 2017-07-06 | Disposition: A | Discharge status: Home / Self Care | Payer: Self-pay | Attending: Emergency Medicine | Admitting: Emergency Medicine

## 2017-07-06 DIAGNOSIS — E860 Dehydration: Secondary | ICD-10-CM | POA: Insufficient documentation

## 2017-07-06 DIAGNOSIS — G43A0 Cyclical vomiting, not intractable: Secondary | ICD-10-CM | POA: Insufficient documentation

## 2017-07-06 DIAGNOSIS — R1115 Cyclical vomiting syndrome unrelated to migraine: Secondary | ICD-10-CM

## 2017-07-06 DIAGNOSIS — F17210 Nicotine dependence, cigarettes, uncomplicated: Secondary | ICD-10-CM | POA: Insufficient documentation

## 2017-07-06 LAB — CBC
HCT: 35.5 % — ABNORMAL LOW (ref 36.0–46.0)
HEMOGLOBIN: 12.1 g/dL (ref 12.0–15.0)
MCH: 34.7 pg — ABNORMAL HIGH (ref 26.0–34.0)
MCHC: 34.1 g/dL (ref 30.0–36.0)
MCV: 101.7 fL — ABNORMAL HIGH (ref 78.0–100.0)
Platelets: 265 10*3/uL (ref 150–400)
RBC: 3.49 MIL/uL — AB (ref 3.87–5.11)
RDW: 12.1 % (ref 11.5–15.5)
WBC: 10.4 10*3/uL (ref 4.0–10.5)

## 2017-07-06 LAB — COMPREHENSIVE METABOLIC PANEL
ALK PHOS: 39 U/L (ref 38–126)
ALT: 40 U/L (ref 14–54)
ANION GAP: 12 (ref 5–15)
AST: 50 U/L — ABNORMAL HIGH (ref 15–41)
Albumin: 4.7 g/dL (ref 3.5–5.0)
BILIRUBIN TOTAL: 0.7 mg/dL (ref 0.3–1.2)
BUN: 6 mg/dL (ref 6–20)
CALCIUM: 9.5 mg/dL (ref 8.9–10.3)
CO2: 21 mmol/L — AB (ref 22–32)
Chloride: 108 mmol/L (ref 101–111)
Creatinine, Ser: 0.61 mg/dL (ref 0.44–1.00)
Glucose, Bld: 122 mg/dL — ABNORMAL HIGH (ref 65–99)
Potassium: 3.3 mmol/L — ABNORMAL LOW (ref 3.5–5.1)
SODIUM: 141 mmol/L (ref 135–145)
TOTAL PROTEIN: 8 g/dL (ref 6.5–8.1)

## 2017-07-06 LAB — URINALYSIS, ROUTINE W REFLEX MICROSCOPIC
Bacteria, UA: NONE SEEN
Bilirubin Urine: NEGATIVE
GLUCOSE, UA: NEGATIVE mg/dL
Hgb urine dipstick: NEGATIVE
KETONES UR: NEGATIVE mg/dL
Leukocytes, UA: NEGATIVE
NITRITE: NEGATIVE
PROTEIN: 100 mg/dL — AB
Specific Gravity, Urine: 1.023 (ref 1.005–1.030)
pH: 8 (ref 5.0–8.0)

## 2017-07-06 LAB — LIPASE, BLOOD: Lipase: 22 U/L (ref 11–51)

## 2017-07-06 LAB — PREGNANCY, URINE: Preg Test, Ur: NEGATIVE

## 2017-07-06 MED ORDER — ONDANSETRON HCL 4 MG PO TABS: 4.0000 mg | ORAL_TABLET | Freq: Three times a day (TID) | ORAL | 0 refills | Status: DC | PRN

## 2017-07-06 MED ORDER — ONDANSETRON HCL 4 MG/2ML IJ SOLN
INTRAMUSCULAR | Status: AC
  Filled 2017-07-06: qty 2

## 2017-07-06 MED ORDER — SODIUM CHLORIDE 0.9 % IV BOLUS (SEPSIS)
1000.0000 mL | Freq: Once | INTRAVENOUS | Status: AC
  Administered 2017-07-06: 1000 mL via INTRAVENOUS

## 2017-07-06 MED ORDER — ONDANSETRON HCL 4 MG/2ML IJ SOLN
4.0000 mg | Freq: Once | INTRAMUSCULAR | Status: AC | PRN
  Administered 2017-07-06: 4 mg via INTRAVENOUS

## 2017-07-06 NOTE — ED Provider Notes (Signed)
[facility] Provider Note   CSN: [id number] Arrival date & time: 07/06/17  1652     History   Chief Complaint Chief Complaint  Patient presents with  . Emesis    HPI Megan Nolan is a 31 y.o. female who presents today with chief complaint acute onset, persistent nausea and vomiting. She states that she awoke at around 8 AM this morning and has been vomiting persistently. Emesis is now bilious but nonbloody. Has not been able to keep any food down. She states that last night she went out with friends and had 5 alcoholic liquor beverages which is more than she usually has. She states she does not awake with any abdominal pain but on persistent vomiting she has developed generalized tightness and crampy sensation. She states that she ate chicken wings and mozzarella sticks from [employer] last night prior to drinking alcohol. She denies diarrhea, constipation, melena, dysuria, hematuria, or vaginal symptoms. She endorses chills but no fevers. She has not tried anything for her symptoms. She does state that Zofran given while in triage has been very helpful.  The history is provided by the patient.    Past Medical History:  Diagnosis Date  . Abnormal Pap smear   . Bacterial vaginosis   . Medical history non-contributory   . No pertinent past medical history     There are no active problems to display for this patient.   Past Surgical History:  Procedure Laterality Date  . CERVIX LESION DESTRUCTION    . WISDOM TOOTH EXTRACTION      OB History    Gravida Para Term Preterm AB Living   3 1 1   1 1    SAB TAB Ectopic Multiple Live Births   1       1       Home Medications    Prior to Admission medications   Medication Sig Start Date End Date Taking? Authorizing Provider  Multiple Vitamin (MULTIVITAMIN) tablet Take 1 tablet by mouth daily.   Yes [provider]  fluconazole (DIFLUCAN) 150 MG tablet Take 1 tablet (150 mg total) by mouth daily. Take  second dose 72 hours later if symptoms still persists. Patient not taking: Reported on 07/06/2017 06/28/17   Belinda Fisher, PA-C  metroNIDAZOLE (FLAGYL) 500 MG tablet Take 1 tablet (500 mg total) by mouth 2 (two) times daily. Patient not taking: Reported on 07/06/2017 06/28/17   Belinda Fisher, PA-C  ondansetron (ZOFRAN) 4 MG tablet Take 1 tablet (4 mg total) by mouth every 8 (eight) hours as needed for nausea or vomiting. 07/06/17   Jeanie Sewer, PA-C    Family History Family History  Problem Relation Age of Onset  . Cancer Maternal Grandmother     Social History Social History  Substance Use Topics  . Smoking status: Former Smoker    Types: Cigarettes    Quit date: 04/01/2012  . Smokeless tobacco: Never Used  . Alcohol use Yes     Comment: occasionally     Allergies   Patient has no known allergies.   Review of Systems Review of Systems  Constitutional: Positive for chills. Negative for fever.  Respiratory: Negative for shortness of breath.   Cardiovascular: Negative for chest pain.  Gastrointestinal: Positive for abdominal pain, nausea and vomiting. Negative for blood in stool, constipation and diarrhea.  Genitourinary: Negative for dysuria, hematuria, vaginal bleeding, vaginal discharge and vaginal pain.  All other systems reviewed and are negative.    Physical  Exam Updated Vital Signs BP (!) 152/64 (BP Location: Left Arm)   Pulse 93   Temp (!) 97.4 F (36.3 C) (Oral)   Resp 20   LMP 06/30/2017   SpO2 98%   Physical Exam  Constitutional: She appears well-developed and well-nourished. No distress.  Resting comfortably in bed  HENT:  Head: Normocephalic and atraumatic.  Eyes: Conjunctivae are normal. Right eye exhibits no discharge. Left eye exhibits no discharge.  Neck: No JVD present. No tracheal deviation present.  Cardiovascular: Normal rate, regular rhythm, normal heart sounds and intact distal pulses.   Pulmonary/Chest: Effort normal and breath sounds normal.    Abdominal: Soft. Bowel sounds are normal. She exhibits no distension. There is no tenderness.  Murphy's sign absent, Rovsing sign absent, no CVA tenderness, no tenderness to palpation at McBurney's point  Musculoskeletal: She exhibits no edema.  Neurological: She is alert.  Skin: Skin is warm. No erythema.  Psychiatric: She has a normal mood and affect. Her behavior is normal.  Nursing note and vitals reviewed.    ED Treatments / Results  Labs (all labs ordered are listed, but only abnormal results are displayed) Labs Reviewed  COMPREHENSIVE METABOLIC PANEL - Abnormal; Notable for the following:       Result Value   Potassium 3.3 (*)    CO2 21 (*)    Glucose, Bld 122 (*)    AST 50 (*)    All other components within normal limits  CBC - Abnormal; Notable for the following:    RBC 3.49 (*)    HCT 35.5 (*)    MCV 101.7 (*)    MCH 34.7 (*)    All other components within normal limits  URINALYSIS, ROUTINE W REFLEX MICROSCOPIC - Abnormal; Notable for the following:    APPearance HAZY (*)    Protein, ur 100 (*)    Squamous Epithelial / LPF 0-5 (*)    All other components within normal limits  LIPASE, BLOOD  PREGNANCY, URINE    EKG  EKG Interpretation None       Radiology No results found.  Procedures Procedures (including critical care time)  Medications Ordered in ED Medications  ondansetron (ZOFRAN) injection 4 mg (4 mg Intravenous Given 07/06/17 1704)  sodium chloride 0.9 % bolus 1,000 mL (1,000 mLs Intravenous New Bag/Given 07/06/17 1844)     Initial Impression / Assessment and Plan / ED Course  I have reviewed the triage vital signs and the nursing notes.  Pertinent labs & imaging results that were available during my care of the patient were reviewed by me and considered in my medical decision making (see chart for details).     Patient with cyclical nausea and vomiting after drinking alcohol last night. Afebrile, vital signs are stable. Abdominal  examination is unremarkable. Lab work is consistent with persistent vomiting, but patient does not have a leukocytosis or significant electrolyte abnormality. UA is not concerning for nephrolithiasis or UTI. In the absence of pain or fever, I doubt PID, ectopic pregnancy, TOA, ovarian torsion, colitis, appendicitis, or other emergent surgical abnormality. Patient has not vomited since being given Zofran, and she has been tolerating PO food and fluids. Repeat abdominal examination remains unremarkable. She is stable for discharge home with Zofran, bland diet, and follow-up with primary care physician if symptoms persist. Discussed indications for return to the ED. Pt verbalized understanding of and agreement with plan and is safe for discharge home at this time.   Final Clinical Impressions(s) / ED Diagnoses  Final diagnoses:  Non-intractable cyclical vomiting with nausea  Dehydration    New Prescriptions New Prescriptions   ONDANSETRON (ZOFRAN) 4 MG TABLET    Take 1 tablet (4 mg total) by mouth every 8 (eight) hours as needed for nausea or vomiting.     Jeanie Sewer, PA-C 07/06/17 2054    Mancel Bale, MD 07/07/17 [phone]

## 2017-07-06 NOTE — Discharge Instructions (Signed)
You may take Zofran up to 3 times a day as needed for nausea and vomiting. Drink plenty of fluids and get plenty of rest. Eat a diet of bland foods that will not upset her stomach for the next few days. I have attached some information of food suggestions. Follow-up with primary care physician if your symptoms persist.Return to the ED if any concerning signs or symptoms develop such as fevers, blood in your urine or stool, or if you are unable to keep any food or drink down for a few days.

## 2017-07-06 NOTE — ED Triage Notes (Signed)
Pt arrives via POv from home with nausea/vomiting since last night. Pt admits to heavy liquor drinking last night. Vomiting in triage.

## 2018-01-19 ENCOUNTER — Encounter (HOSPITAL_COMMUNITY): Payer: Self-pay | Admitting: Family Medicine

## 2018-01-19 ENCOUNTER — Ambulatory Visit (HOSPITAL_COMMUNITY)
Admission: EM | Admit: 2018-01-19 | Discharge: 2018-01-19 | Disposition: A | Discharge status: Home / Self Care | Payer: Medicaid Other | Attending: Family Medicine | Admitting: Family Medicine

## 2018-01-19 DIAGNOSIS — Z113 Encounter for screening for infections with a predominantly sexual mode of transmission: Secondary | ICD-10-CM | POA: Diagnosis not present

## 2018-01-19 DIAGNOSIS — Z87891 Personal history of nicotine dependence: Secondary | ICD-10-CM | POA: Insufficient documentation

## 2018-01-19 DIAGNOSIS — N9089 Other specified noninflammatory disorders of vulva and perineum: Secondary | ICD-10-CM | POA: Insufficient documentation

## 2018-01-19 DIAGNOSIS — Z3202 Encounter for pregnancy test, result negative: Secondary | ICD-10-CM | POA: Diagnosis not present

## 2018-01-19 DIAGNOSIS — N898 Other specified noninflammatory disorders of vagina: Secondary | ICD-10-CM | POA: Insufficient documentation

## 2018-01-19 LAB — POCT URINALYSIS DIP (DEVICE)
BILIRUBIN URINE: NEGATIVE
Glucose, UA: NEGATIVE mg/dL
Hgb urine dipstick: NEGATIVE
KETONES UR: NEGATIVE mg/dL
Leukocytes, UA: NEGATIVE
Nitrite: NEGATIVE
PH: 7 (ref 5.0–8.0)
PROTEIN: NEGATIVE mg/dL
Specific Gravity, Urine: 1.02 (ref 1.005–1.030)
Urobilinogen, UA: 0.2 mg/dL (ref 0.0–1.0)

## 2018-01-19 LAB — POCT PREGNANCY, URINE: Preg Test, Ur: NEGATIVE

## 2018-01-19 MED ORDER — METRONIDAZOLE 500 MG PO TABS: 500.0000 mg | ORAL_TABLET | Freq: Two times a day (BID) | ORAL | 0 refills | Status: AC

## 2018-01-19 NOTE — Discharge Instructions (Signed)
Will notify you of any positive findings and if any changes to treatment are needed.   Do not drink alcohol while taking prescribed medication. Withhold from intercourse for the next week. If symptoms worsen or do not improve in the next week to return to be seen or to follow up with your primary care provider

## 2018-01-19 NOTE — ED Provider Notes (Signed)
[facility]    CSN: [id number] Arrival date & time: 01/19/18  1619     History   Chief Complaint Chief Complaint  Patient presents with  . Vaginal Discharge    HPI Megan Nolan is a 32 y.o. female.   Megan Nolan presents with complaints of vaginal discharge with odor which started 3/3 as well as area of irritation to labia. 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 States has had BV in the past with similar odor and dc. Denies any pelvic pain, abdominal pain,vaginal bleeding, urinary symptoms. States has not had an std since she was a teenager. No known history of herpes. States she has had the same sexual partner for years but would like std screening.    ROS per HPI.       Past Medical History:  Diagnosis Date  . Abnormal Pap smear   . Bacterial vaginosis   . Medical history non-contributory   . No pertinent past medical history     There are no active problems to display for this patient.   Past Surgical History:  Procedure Laterality Date  . CERVIX LESION DESTRUCTION    . WISDOM TOOTH EXTRACTION      OB History    Gravida Para Term Preterm AB Living   3 1 1   1 1    SAB TAB Ectopic Multiple Live Births   1       1       Home Medications    Prior to Admission medications   Medication Sig Start Date End Date Taking? Authorizing Provider  metroNIDAZOLE (FLAGYL) 500 MG tablet Take 1 tablet (500 mg total) by mouth 2 (two) times daily for 7 days. 01/19/18 01/26/18  Georgetta Haber, NP  Multiple Vitamin (MULTIVITAMIN) tablet Take 1 tablet by mouth daily.    [provider]    Family History Family History  Problem Relation Age of Onset  . Cancer Maternal Grandmother     Social History Social History   Tobacco Use  . Smoking status: Former Smoker    Types: Cigarettes    Last attempt to quit: 04/01/2012    Years since  quitting: 5.8  . Smokeless tobacco: Never Used  Substance Use Topics  . Alcohol use: Yes    Comment: occasionally  . Drug use: No     Allergies   Patient has no known allergies.   Review of Systems Review of Systems   Physical Exam Triage Vital Signs ED Triage Vitals [01/19/18 1657]  Enc Vitals Group     BP 112/60     Pulse Rate 81     Resp 18     Temp 98.5 F (36.9 C)     Temp src      SpO2 100 %     Weight      Height      Head Circumference      Peak Flow      Pain Score      Pain Loc      Pain Edu?      Excl. in GC?    No data found.  Updated Vital Signs BP 112/60   Pulse 81   Temp 98.5 F (36.9 C)   Resp 18   LMP 01/03/2018   SpO2 100%   Visual Acuity Right Eye Distance:  Left Eye Distance:   Bilateral Distance:    Right Eye Near:   Left Eye Near:    Bilateral Near:     Physical Exam  Constitutional: She is oriented to person, place, and time. She appears well-developed and well-nourished. No distress.  Cardiovascular: Normal rate, regular rhythm and normal heart sounds.  Pulmonary/Chest: Effort normal and breath sounds normal.  Abdominal: Soft. Bowel sounds are normal. There is no tenderness.  Genitourinary:    There is lesion on the right labia.  Genitourinary Comments: External exam of vulva with thin white discharge noted; open lesion noted, tender; hsv culture obtained; without drainage; without surrounding redness  Neurological: She is alert and oriented to person, place, and time.  Skin: Skin is warm and dry.     UC Treatments / Results  Labs (all labs ordered are listed, but only abnormal results are displayed) Labs Reviewed  HSV CULTURE AND TYPING  POCT PREGNANCY, URINE  POCT URINALYSIS DIP (DEVICE)  URINE CYTOLOGY ANCILLARY ONLY    EKG  EKG Interpretation None       Radiology No results found.  Procedures Procedures (including critical care time)  Medications Ordered in UC Medications - No data to  display   Initial Impression / Assessment and Plan / UC Course  I have reviewed the triage vital signs and the nursing notes.  Pertinent labs & imaging results that were available during my care of the patient were reviewed by me and considered in my medical decision making (see chart for details).     Will treat for bv as urine cytology pending at this time. No specific known std exposures known at this time. Concern for possible herpes so culture collected. Patient quite upset and tearful in regards to this, will treat if positive finding. Based on location could also be skin breakdown from discharge and friction. Will notify of any positive findings and if any changes to treatment are needed.   If symptoms worsen or do not improve in the next week to return to be seen or to follow up with PCP.  Patient verbalized understanding and agreeable to plan.    Final Clinical Impressions(s) / UC Diagnoses   Final diagnoses:  Vaginal discharge  Vulvar lesion    ED Discharge Orders        Ordered    metroNIDAZOLE (FLAGYL) 500 MG tablet  2 times daily     01/19/18 1752       Controlled Substance Prescriptions [place] Controlled Substance Registry consulted? Not Applicable   Georgetta HaberBurky, Natalie B, NP 01/19/18 1805

## 2018-01-19 NOTE — ED Triage Notes (Signed)
Pt here vaginal discharge with odor, itching and irritation. Reports that she wants to be checked for STD. She also uses nair and has a bump in the vaginal area.

## 2018-01-21 LAB — URINE CYTOLOGY ANCILLARY ONLY
CHLAMYDIA, DNA PROBE: NEGATIVE
NEISSERIA GONORRHEA: NEGATIVE
TRICH (WINDOWPATH): NEGATIVE

## 2018-01-22 ENCOUNTER — Telehealth (HOSPITAL_COMMUNITY): Payer: Self-pay | Admitting: Internal Medicine

## 2018-01-22 MED ORDER — FAMCICLOVIR 250 MG PO TABS: 250.0000 mg | ORAL_TABLET | Freq: Three times a day (TID) | ORAL | 0 refills | Status: AC

## 2018-01-22 NOTE — Telephone Encounter (Signed)
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  Rx famcyclovir sent to the pharmacy of record, [facility] [address] [facility] [address]  Open sores are contagious and can be spread to others.  Condoms may reduce risk of spreading infection.  Recheck for further evaluation if symptoms are not improving.  Tests for BV and yeast are still pending.  LM

## 2018-01-23 LAB — HSV CULTURE AND TYPING

## 2018-01-23 LAB — URINE CYTOLOGY ANCILLARY ONLY: Candida vaginitis: NEGATIVE

## 2018-08-14 ENCOUNTER — Encounter (HOSPITAL_COMMUNITY): Payer: Self-pay | Admitting: Emergency Medicine

## 2018-08-14 ENCOUNTER — Other Ambulatory Visit: Payer: Self-pay

## 2018-08-14 ENCOUNTER — Emergency Department (HOSPITAL_COMMUNITY)
Admission: EM | Admit: 2018-08-14 | Discharge: 2018-08-14 | Disposition: A | Discharge status: Home / Self Care | Payer: Medicaid Other | Attending: Emergency Medicine | Admitting: Emergency Medicine

## 2018-08-14 DIAGNOSIS — B9689 Other specified bacterial agents as the cause of diseases classified elsewhere: Secondary | ICD-10-CM

## 2018-08-14 DIAGNOSIS — N760 Acute vaginitis: Secondary | ICD-10-CM | POA: Insufficient documentation

## 2018-08-14 DIAGNOSIS — Z87891 Personal history of nicotine dependence: Secondary | ICD-10-CM | POA: Insufficient documentation

## 2018-08-14 DIAGNOSIS — N898 Other specified noninflammatory disorders of vagina: Secondary | ICD-10-CM

## 2018-08-14 LAB — URINALYSIS, ROUTINE W REFLEX MICROSCOPIC
Bilirubin Urine: NEGATIVE
Glucose, UA: NEGATIVE mg/dL
Hgb urine dipstick: NEGATIVE
KETONES UR: 5 mg/dL — AB
Leukocytes, UA: NEGATIVE
NITRITE: NEGATIVE
PH: 6 (ref 5.0–8.0)
Protein, ur: NEGATIVE mg/dL
Specific Gravity, Urine: 1.024 (ref 1.005–1.030)

## 2018-08-14 LAB — WET PREP, GENITAL
Sperm: NONE SEEN
Trich, Wet Prep: NONE SEEN
Yeast Wet Prep HPF POC: NONE SEEN

## 2018-08-14 MED ORDER — METRONIDAZOLE 500 MG PO TABS: 500.0000 mg | ORAL_TABLET | Freq: Two times a day (BID) | ORAL | 0 refills | Status: DC

## 2018-08-14 MED ORDER — LIDOCAINE HCL (PF) 1 % IJ SOLN
INTRAMUSCULAR | Status: AC
  Administered 2018-08-14: 2.1 mL
  Filled 2018-08-14: qty 5

## 2018-08-14 MED ORDER — AZITHROMYCIN 250 MG PO TABS
1000.0000 mg | ORAL_TABLET | Freq: Once | ORAL | Status: AC
  Administered 2018-08-14: 1000 mg via ORAL
  Filled 2018-08-14: qty 4

## 2018-08-14 MED ORDER — CEFTRIAXONE SODIUM 250 MG IJ SOLR
250.0000 mg | Freq: Once | INTRAMUSCULAR | Status: AC
  Administered 2018-08-14: 250 mg via INTRAMUSCULAR
  Filled 2018-08-14: qty 250

## 2018-08-14 NOTE — Discharge Instructions (Addendum)
Please read attached information. If you experience any new or worsening signs or symptoms please return to the emergency room for evaluation. Please follow-up with your primary care provider or specialist as discussed. Please use medication prescribed only as directed and discontinue taking if you have any concerning signs or symptoms.   °

## 2018-08-14 NOTE — ED Triage Notes (Signed)
Pt to ER states wants to be checked for everything because her significant is cheating. Pt only s/s is white vaginal discharge.

## 2018-08-14 NOTE — ED Provider Notes (Signed)
[facility] EMERGENCY DEPARTMENT Provider Note   CSN: [id number] Arrival date & time: 08/14/18  1544     History   Chief Complaint Chief Complaint  Patient presents with  . Exposure to STD    HPI Megan Nolan is a 32 y.o. female.  HPI   32 year old female presents today with complaints of vaginal discharge.  Patient notes that she is sexually active with one female partner, she notes that he has been cheating on her.  She notes vaginal discharge over the last week.  She denies any pelvic pain abdominal pain nausea vomiting or fever.  Past Medical History:  Diagnosis Date  . Abnormal Pap smear   . Bacterial vaginosis   . Medical history non-contributory   . No pertinent past medical history     There are no active problems to display for this patient.   Past Surgical History:  Procedure Laterality Date  . CERVIX LESION DESTRUCTION    . WISDOM TOOTH EXTRACTION       OB History    Gravida  3   Para  1   Term  1   Preterm      AB  1   Living  1     SAB  1   TAB      Ectopic      Multiple      Live Births  1            Home Medications    Prior to Admission medications   Medication Sig Start Date End Date Taking? Authorizing Provider  metroNIDAZOLE (FLAGYL) 500 MG tablet Take 1 tablet (500 mg total) by mouth 2 (two) times daily. 08/14/18   Eyvonne Mechanic, PA-C    Family History Family History  Problem Relation Age of Onset  . Cancer Maternal Grandmother     Social History Social History   Tobacco Use  . Smoking status: Former Smoker    Types: Cigarettes    Last attempt to quit: 04/01/2012    Years since quitting: 6.3  . Smokeless tobacco: Never Used  Substance Use Topics  . Alcohol use: Yes    Comment: occasionally  . Drug use: No     Allergies   Patient has no known allergies.   Review of Systems Review of Systems  All other systems reviewed and are negative.   Physical Exam Updated Vital  Signs BP 116/79 (BP Location: Left Arm)   Pulse 76   Temp 98.2 F (36.8 C) (Oral)   Resp 16   LMP 08/06/2018   SpO2 98%   Physical Exam  Constitutional: She is oriented to person, place, and time. She appears well-developed and well-nourished.  HENT:  Head: Normocephalic and atraumatic.  Eyes: Pupils are equal, round, and reactive to light. Conjunctivae are normal. Right eye exhibits no discharge. Left eye exhibits no discharge. No scleral icterus.  Neck: Normal range of motion. No JVD present. No tracheal deviation present.  Pulmonary/Chest: Effort normal. No stridor.  Abdominal: Soft. She exhibits no distension and no mass. There is no tenderness. There is no rebound and no guarding. No hernia.  Genitourinary:  Genitourinary Comments: Purulent vaginal discharge noted in vaginal vault, no cervical motion redness, adnexal tenderness or masses-rashes or lesions  Neurological: She is alert and oriented to person, place, and time. Coordination normal.  Psychiatric: She has a normal mood and affect. Her behavior is normal. Judgment and thought content normal.  Nursing note and vitals reviewed.  ED Treatments / Results  Labs (all labs ordered are listed, but only abnormal results are displayed) Labs Reviewed  WET PREP, GENITAL - Abnormal; Notable for the following components:      Result Value   Clue Cells Wet Prep HPF POC PRESENT (*)    WBC, Wet Prep HPF POC MANY (*)    All other components within normal limits  URINALYSIS, ROUTINE W REFLEX MICROSCOPIC - Abnormal; Notable for the following components:   APPearance HAZY (*)    Ketones, ur 5 (*)    All other components within normal limits  POC URINE PREG, ED  GC/CHLAMYDIA PROBE AMP ([place]) NOT AT [facility]    EKG None  Radiology No results found.  Procedures Procedures (including critical care time)  Medications Ordered in ED Medications  cefTRIAXone (ROCEPHIN) injection 250 mg (250 mg Intramuscular Given 08/14/18  1935)  azithromycin (ZITHROMAX) tablet 1,000 mg (1,000 mg Oral Given 08/14/18 1935)  lidocaine (PF) (XYLOCAINE) 1 % injection (2.1 mLs  Given 08/14/18 1935)     Initial Impression / Assessment and Plan / ED Course  I have reviewed the triage vital signs and the nursing notes.  Pertinent labs & imaging results that were available during my care of the patient were reviewed by me and considered in my medical decision making (see chart for details).     Labs:   Imaging:  Consults:  Therapeutics: Azithromycin, ceftriaxone  Discharge Meds: Metronidazole  Assessment/Plan: 32 year old female presents today with vaginal discharge, suspicion for STD.  She will be treated prophylactically after discussion with her.  Patient also has clue cells will be treated for bacterial vaginosis.  Return precautions given, she verbalized understanding and agreement to today's plan.  No signs of PID.    Final Clinical Impressions(s) / ED Diagnoses   Final diagnoses:  Vaginal discharge  Bacterial vaginosis    ED Discharge Orders         Ordered    metroNIDAZOLE (FLAGYL) 500 MG tablet  2 times daily     08/14/18 1941           Eyvonne Mechanic, PA-C 08/14/18 2009    Virgina Norfolk, DO 08/15/18 [phone]

## 2018-08-15 LAB — GC/CHLAMYDIA PROBE AMP (~~LOC~~) NOT AT ARMC
Chlamydia: NEGATIVE
Neisseria Gonorrhea: NEGATIVE

## 2019-12-28 ENCOUNTER — Ambulatory Visit (HOSPITAL_COMMUNITY)
Admission: EM | Admit: 2019-12-28 | Discharge: 2019-12-28 | Disposition: A | Discharge status: Home / Self Care | Payer: Medicaid Other | Attending: Emergency Medicine | Admitting: Emergency Medicine

## 2019-12-28 ENCOUNTER — Other Ambulatory Visit: Payer: Self-pay

## 2019-12-28 DIAGNOSIS — N898 Other specified noninflammatory disorders of vagina: Secondary | ICD-10-CM | POA: Insufficient documentation

## 2019-12-28 DIAGNOSIS — Z113 Encounter for screening for infections with a predominantly sexual mode of transmission: Secondary | ICD-10-CM

## 2019-12-28 DIAGNOSIS — Z3202 Encounter for pregnancy test, result negative: Secondary | ICD-10-CM

## 2019-12-28 LAB — POC URINE PREG, ED: Preg Test, Ur: NEGATIVE

## 2019-12-28 LAB — POCT PREGNANCY, URINE: Preg Test, Ur: NEGATIVE

## 2019-12-28 MED ORDER — METRONIDAZOLE 500 MG PO TABS: 500.0000 mg | ORAL_TABLET | Freq: Two times a day (BID) | ORAL | 0 refills | Status: AC

## 2019-12-28 NOTE — Discharge Instructions (Signed)
We will start treatment for BV as we await the final results of your testing.  Will notify of any positive findings and if any changes to treatment are needed.  You may monitor your results on your MyChart online as well.   If symptoms worsen or do not improve in the next week to return to be seen or to follow up with your PCP.

## 2019-12-28 NOTE — ED Provider Notes (Signed)
[facility]    CSN: [id number] Arrival date & time: 12/28/19  3762      History   Chief Complaint Chief Complaint  Patient presents with  . Vaginitis    HPI SHELLYE ZANDI is a 34 y.o. female.   ZAMARIA BRAZZLE presents with complaints of vaginal discharge with odor for the past week. It is yellow and white in color. No itching. Has had bv in the past and feels it may have been somewhat similar. She is concerned about her partner and potential infidelity however. Doesn't use condoms. She is on depo. No pelvic pain or back pain. No urinary symptoms. No fevers. Doesn't have periods.    ROS per HPI, negative if not otherwise mentioned.      Past Medical History:  Diagnosis Date  . Abnormal Pap smear   . Bacterial vaginosis   . Medical history non-contributory   . No pertinent past medical history     There are no problems to display for this patient.   Past Surgical History:  Procedure Laterality Date  . CERVIX LESION DESTRUCTION    . WISDOM TOOTH EXTRACTION      OB History    Gravida  3   Para  1   Term  1   Preterm      AB  1   Living  1     SAB  1   TAB      Ectopic      Multiple      Live Births  1            Home Medications    Prior to Admission medications   Medication Sig Start Date End Date Taking? Authorizing Provider  medroxyPROGESTERone Acetate (DEPO-PROVERA IM) Inject into the muscle.   Yes [provider]  metroNIDAZOLE (FLAGYL) 500 MG tablet Take 1 tablet (500 mg total) by mouth 2 (two) times daily for 7 days. 12/28/19 01/04/20  Georgetta Haber, NP    Family History Family History  Problem Relation Age of Onset  . Cancer Maternal Grandmother     Social History Social History   Tobacco Use  . Smoking status: Former Smoker    Types: Cigarettes    Quit date: 04/01/2012    Years since quitting: 7.7  . Smokeless tobacco: Never Used  Substance Use Topics  . Alcohol use: Yes    Comment:  occasionally  . Drug use: No     Allergies   Patient has no known allergies.   Review of Systems Review of Systems   Physical Exam Triage Vital Signs ED Triage Vitals  Enc Vitals Group     BP 12/28/19 0851 123/72     Pulse Rate 12/28/19 0851 90     Resp 12/28/19 0851 16     Temp 12/28/19 0851 98.4 F (36.9 C)     Temp Source 12/28/19 0851 Oral     SpO2 12/28/19 0851 100 %     Weight --      Height --      Head Circumference --      Peak Flow --      Pain Score 12/28/19 0849 0     Pain Loc --      Pain Edu? --      Excl. in GC? --    No data found.  Updated Vital Signs BP 123/72 (BP Location: Left Arm)   Pulse 90   Temp 98.4 F (36.9 C) (Oral)  Resp 16   SpO2 100%   Visual Acuity Right Eye Distance:   Left Eye Distance:   Bilateral Distance:    Right Eye Near:   Left Eye Near:    Bilateral Near:     Physical Exam Constitutional:      General: She is not in acute distress.    Appearance: She is well-developed.  Cardiovascular:     Rate and Rhythm: Normal rate.  Pulmonary:     Effort: Pulmonary effort is normal.  Abdominal:     Palpations: Abdomen is not rigid.     Tenderness: There is no abdominal tenderness. There is no guarding or rebound.  Genitourinary:    Comments: Denies sores, lesions, vaginal bleeding; no pelvic pain; gu exam deferred at this time, vaginal self swab collected.   Skin:    General: Skin is warm and dry.  Neurological:     Mental Status: She is alert and oriented to person, place, and time.      UC Treatments / Results  Labs (all labs ordered are listed, but only abnormal results are displayed) Labs Reviewed  POC URINE PREG, ED  POCT PREGNANCY, URINE  CERVICOVAGINAL ANCILLARY ONLY    EKG   Radiology No results found.  Procedures Procedures (including critical care time)  Medications Ordered in UC Medications - No data to display  Initial Impression / Assessment and Plan / UC Course  I have reviewed the  triage vital signs and the nursing notes.  Pertinent labs & imaging results that were available during my care of the patient were reviewed by me and considered in my medical decision making (see chart for details).     Flagyl initiated pending results of vaginal cytology. Will notify of any positive findings and if any changes to treatment are needed.  Return precautions provided. Patient verbalized understanding and agreeable to plan.   Final Clinical Impressions(s) / UC Diagnoses   Final diagnoses:  Vaginal discharge  Screen for STD (sexually transmitted disease)     Discharge Instructions     We will start treatment for BV as we await the final results of your testing.  Will notify of any positive findings and if any changes to treatment are needed.  You may monitor your results on your MyChart online as well.   If symptoms worsen or do not improve in the next week to return to be seen or to follow up with your PCP.      ED Prescriptions    Medication Sig Dispense Auth. Provider   metroNIDAZOLE (FLAGYL) 500 MG tablet Take 1 tablet (500 mg total) by mouth 2 (two) times daily for 7 days. 14 tablet Zigmund Gottron, NP     PDMP not reviewed this encounter.   Zigmund Gottron, NP 12/28/19 2014

## 2019-12-28 NOTE — ED Triage Notes (Signed)
Pt c/o vaginal discomfort, itching, discharge for approx 1 week. Denies fever, chills, abd pain.

## 2020-01-01 LAB — CERVICOVAGINAL ANCILLARY ONLY
Bacterial vaginitis: POSITIVE — AB
Candida vaginitis: NEGATIVE
Chlamydia: NEGATIVE
Neisseria Gonorrhea: NEGATIVE
Trichomonas: NEGATIVE

## 2020-03-28 ENCOUNTER — Ambulatory Visit (HOSPITAL_COMMUNITY)
Admission: EM | Admit: 2020-03-28 | Discharge: 2020-03-28 | Disposition: A | Discharge status: Home / Self Care | Payer: Medicaid Other | Attending: Family Medicine | Admitting: Family Medicine

## 2020-03-28 ENCOUNTER — Other Ambulatory Visit: Payer: Self-pay

## 2020-03-28 ENCOUNTER — Encounter (HOSPITAL_COMMUNITY): Payer: Self-pay

## 2020-03-28 DIAGNOSIS — N898 Other specified noninflammatory disorders of vagina: Secondary | ICD-10-CM

## 2020-03-28 LAB — POCT URINALYSIS DIP (DEVICE)
Bilirubin Urine: NEGATIVE
Glucose, UA: NEGATIVE mg/dL
Ketones, ur: NEGATIVE mg/dL
Leukocytes,Ua: NEGATIVE
Nitrite: NEGATIVE
Protein, ur: NEGATIVE mg/dL
Specific Gravity, Urine: >=1.03 (ref 1.005–1.030)
Urobilinogen, UA: 0.2 mg/dL (ref 0.0–1.0)
pH: 6 (ref 5.0–8.0)

## 2020-03-28 LAB — POC URINE PREG, ED: Preg Test, Ur: NEGATIVE

## 2020-03-28 MED ORDER — METRONIDAZOLE 500 MG PO TABS: 500.0000 mg | ORAL_TABLET | Freq: Two times a day (BID) | ORAL | 0 refills | Status: AC

## 2020-03-28 NOTE — ED Triage Notes (Signed)
Pt reports white vaginal discharge x 3 days, Pt denies any other symptoms.

## 2020-03-28 NOTE — Discharge Instructions (Addendum)
Begin metronidazole twice daily for 1 week with food to treat bacterial vaginosis. Avoid alcohol until 24 hours after last tablet.   We are testing you for Gonorrhea, Chlamydia, Trichomonas, Yeast and Bacterial Vaginosis. We will call you if anything is positive and let you know if you require any further treatment. Please inform partners of any positive results.   Please return if symptoms not improving with treatment, development of fever, nausea, vomiting, abdominal pain.

## 2020-03-28 NOTE — ED Provider Notes (Signed)
[facility]    CSN: [id number] Arrival date & time: 03/28/20  1404      History   Chief Complaint Chief Complaint  Patient presents with  . Vaginal Discharge    HPI Megan Nolan is a 34 y.o. female history of recurrent BV presenting today for evaluation of vaginal discharge.  Patient notes over the past 3 days she has had discharge with some slight associated discomfort.  She has also had urinary frequency.  Describes discharge as clearish white color and is thicker.  She denies any abdominal pain nausea or vomiting.  Denies dysuria.  Denies history of UTIs.  Is on Depo-Provera for birth control, next injection due June 23.  Does not have regular menstrual cycles with birth control.  HPI  Past Medical History:  Diagnosis Date  . Abnormal Pap smear   . Bacterial vaginosis   . Medical history non-contributory   . No pertinent past medical history     There are no problems to display for this patient.   Past Surgical History:  Procedure Laterality Date  . CERVIX LESION DESTRUCTION    . WISDOM TOOTH EXTRACTION      OB History    Gravida  3   Para  1   Term  1   Preterm      AB  1   Living  1     SAB  1   TAB      Ectopic      Multiple      Live Births  1            Home Medications    Prior to Admission medications   Medication Sig Start Date End Date Taking? Authorizing Provider  medroxyPROGESTERone Acetate (DEPO-PROVERA IM) Inject into the muscle.    [provider]  metroNIDAZOLE (FLAGYL) 500 MG tablet Take 1 tablet (500 mg total) by mouth 2 (two) times daily for 7 days. 03/28/20 04/04/20  Arvon Schreiner, Junius Creamer, PA-C    Family History Family History  Problem Relation Age of Onset  . Cancer Maternal Grandmother     Social History Social History   Tobacco Use  . Smoking status: Former Smoker    Types: Cigarettes    Quit date: 04/01/2012    Years since quitting: 7.9  . Smokeless tobacco: Never Used  Substance  Use Topics  . Alcohol use: Yes    Comment: occasionally  . Drug use: No     Allergies   Patient has no known allergies.   Review of Systems Review of Systems  Constitutional: Negative for fever.  Respiratory: Negative for shortness of breath.   Cardiovascular: Negative for chest pain.  Gastrointestinal: Negative for abdominal pain, diarrhea, nausea and vomiting.  Genitourinary: Positive for frequency and vaginal discharge. Negative for dysuria, flank pain, genital sores, hematuria, menstrual problem, vaginal bleeding and vaginal pain.  Musculoskeletal: Negative for back pain.  Skin: Negative for rash.  Neurological: Negative for dizziness, light-headedness and headaches.     Physical Exam Triage Vital Signs ED Triage Vitals  Enc Vitals Group     BP 03/28/20 1454 118/75     Pulse Rate 03/28/20 1454 88     Resp 03/28/20 1454 16     Temp 03/28/20 1454 98.1 F (36.7 C)     Temp Source 03/28/20 1454 Oral     SpO2 03/28/20 1454 98 %     Weight --      Height --  Head Circumference --      Peak Flow --      Pain Score 03/28/20 1459 0     Pain Loc --      Pain Edu? --      Excl. in GC? --    No data found.  Updated Vital Signs BP 118/75 (BP Location: Left Arm)   Pulse 88   Temp 98.1 F (36.7 C) (Oral)   Resp 16   SpO2 98%   Visual Acuity Right Eye Distance:   Left Eye Distance:   Bilateral Distance:    Right Eye Near:   Left Eye Near:    Bilateral Near:     Physical Exam Vitals and nursing note reviewed.  Constitutional:      Appearance: She is well-developed.     Comments: No acute distress  HENT:     Head: Normocephalic and atraumatic.     Nose: Nose normal.  Eyes:     Conjunctiva/sclera: Conjunctivae normal.  Cardiovascular:     Rate and Rhythm: Normal rate.  Pulmonary:     Effort: Pulmonary effort is normal. No respiratory distress.  Abdominal:     General: There is no distension.     Palpations: Abdomen is soft.     Comments: Nontender  to palpation throughout abdomen  Musculoskeletal:        General: Normal range of motion.     Cervical back: Neck supple.  Skin:    General: Skin is warm and dry.  Neurological:     Mental Status: She is alert and oriented to person, place, and time.      UC Treatments / Results  Labs (all labs ordered are listed, but only abnormal results are displayed) Labs Reviewed  POC URINE PREG, ED  CERVICOVAGINAL ANCILLARY ONLY    EKG   Radiology No results found.  Procedures Procedures (including critical care time)  Medications Ordered in UC Medications - No data to display  Initial Impression / Assessment and Plan / UC Course  I have reviewed the triage vital signs and the nursing notes.  Pertinent labs & imaging results that were available during my care of the patient were reviewed by me and considered in my medical decision making (see chart for details).     Pregnancy negative, UA with negative leuks and nitrites, empirically treating for BV based off history with metronidazole twice daily x1 week.  Vaginal swab pending to further evaluate discharge and screen for STDs.  Will call with results and alter treatment as needed.  Discussed strict return precautions. Patient verbalized understanding and is agreeable with plan.  Final Clinical Impressions(s) / UC Diagnoses   Final diagnoses:  Vaginal discharge     Discharge Instructions     Begin metronidazole twice daily for 1 week with food to treat bacterial vaginosis. Avoid alcohol until 24 hours after last tablet.   We are testing you for Gonorrhea, Chlamydia, Trichomonas, Yeast and Bacterial Vaginosis. We will call you if anything is positive and let you know if you require any further treatment. Please inform partners of any positive results.   Please return if symptoms not improving with treatment, development of fever, nausea, vomiting, abdominal pain.    ED Prescriptions    Medication Sig Dispense Auth.  Provider   metroNIDAZOLE (FLAGYL) 500 MG tablet Take 1 tablet (500 mg total) by mouth 2 (two) times daily for 7 days. 14 tablet Yuki Brunsman, Bobo C, PA-C     PDMP not reviewed this encounter.  Janith Lima, [place] 03/28/20 1542

## 2020-03-28 NOTE — ED Notes (Signed)
Patient states her daughter is standing at the bus stop alone and she needs to leave.

## 2020-03-31 LAB — CERVICOVAGINAL ANCILLARY ONLY
Bacterial Vaginitis (gardnerella): NEGATIVE
Candida Glabrata: NEGATIVE
Candida Vaginitis: POSITIVE — AB
Chlamydia: NEGATIVE
Comment: NEGATIVE
Comment: NEGATIVE
Comment: NEGATIVE
Comment: NEGATIVE
Comment: NEGATIVE
Comment: NORMAL
Neisseria Gonorrhea: NEGATIVE
Trichomonas: NEGATIVE

## 2020-04-03 ENCOUNTER — Telehealth (HOSPITAL_COMMUNITY): Payer: Self-pay | Admitting: Family Medicine

## 2020-04-03 MED ORDER — FLUCONAZOLE 150 MG PO TABS: 150.0000 mg | ORAL_TABLET | Freq: Every day | ORAL | 0 refills | Status: DC

## 2020-04-03 NOTE — Telephone Encounter (Signed)
Continues with symptoms Is currently in [place]  culture report is reviewed She has Candida We will give her Diflucan

## 2020-07-17 ENCOUNTER — Encounter (HOSPITAL_COMMUNITY): Payer: Self-pay | Admitting: Emergency Medicine

## 2020-07-17 ENCOUNTER — Ambulatory Visit (HOSPITAL_COMMUNITY)
Admission: EM | Admit: 2020-07-17 | Discharge: 2020-07-17 | Disposition: A | Discharge status: Home / Self Care | Payer: Medicaid Other

## 2020-07-17 ENCOUNTER — Other Ambulatory Visit: Payer: Self-pay

## 2020-07-17 DIAGNOSIS — Z4889 Encounter for other specified surgical aftercare: Secondary | ICD-10-CM

## 2020-07-17 NOTE — ED Triage Notes (Signed)
Pt presents for post breast surgery in [place]. She states she needs her surgical tape removed and wanted someone to check her stitches because they are supposed to dissolve. Pt is in [place]. She denies any problems.

## 2020-07-17 NOTE — Discharge Instructions (Addendum)
Keep area clean and dry Follow up as needed for continued or worsening symptoms

## 2020-07-19 NOTE — ED Provider Notes (Signed)
[facility]    CSN: [id number] Arrival date & time: 07/17/20  0807      History   Chief Complaint Chief Complaint  Patient presents with  . Follow-up    HPI Megan Nolan is a 34 y.o. female.   Pt is a 34 year old female presents today for wound check.  Had breast augmentation done in [place] and is here to have taper moved and wounds looked at.     Past Medical History:  Diagnosis Date  . Abnormal Pap smear   . Bacterial vaginosis   . Medical history non-contributory   . No pertinent past medical history     There are no problems to display for this patient.   Past Surgical History:  Procedure Laterality Date  . CERVIX LESION DESTRUCTION    . WISDOM TOOTH EXTRACTION      OB History    Gravida  3   Para  1   Term  1   Preterm      AB  1   Living  1     SAB  1   TAB      Ectopic      Multiple      Live Births  1            Home Medications    Prior to Admission medications   Medication Sig Start Date End Date Taking? Authorizing Provider  fluconazole (DIFLUCAN) 150 MG tablet Take 1 tablet (150 mg total) by mouth daily. Repeat in 1 week if needed 04/03/20   Eustace Moore, MD  medroxyPROGESTERone Acetate (DEPO-PROVERA IM) Inject into the muscle.    [provider]    Family History Family History  Problem Relation Age of Onset  . Cancer Maternal Grandmother     Social History Social History   Tobacco Use  . Smoking status: Former Smoker    Types: Cigarettes    Quit date: 04/01/2012    Years since quitting: 8.3  . Smokeless tobacco: Never Used  Substance Use Topics  . Alcohol use: Yes    Comment: occasionally  . Drug use: No     Allergies   Patient has no known allergies.   Review of Systems Review of Systems   Physical Exam Triage Vital Signs ED Triage Vitals  Enc Vitals Group     BP 07/17/20 0854 124/77     Pulse Rate 07/17/20 0854 (!) 102     Resp 07/17/20 0854 14     Temp  07/17/20 0854 98.5 F (36.9 C)     Temp Source 07/17/20 0854 Oral     SpO2 07/17/20 0854 98 %     Weight --      Height --      Head Circumference --      Peak Flow --      Pain Score 07/17/20 0851 0     Pain Loc --      Pain Edu? --      Excl. in GC? --    No data found.  Updated Vital Signs BP 124/77 (BP Location: Left Arm)   Pulse (!) 102   Temp 98.5 F (36.9 C) (Oral)   Resp 14   SpO2 98%   Visual Acuity Right Eye Distance:   Left Eye Distance:   Bilateral Distance:    Right Eye Near:   Left Eye Near:    Bilateral Near:     Physical Exam Vitals and nursing note reviewed.  Constitutional:      General: She is not in acute distress.    Appearance: Normal appearance. She is not ill-appearing, toxic-appearing or diaphoretic.  HENT:     Head: Normocephalic.     Nose: Nose normal.  Eyes:     Conjunctiva/sclera: Conjunctivae normal.  Pulmonary:     Effort: Pulmonary effort is normal.  Chest:     Comments: Scarring from breast augmentation and tape in place. Musculoskeletal:        General: Normal range of motion.     Cervical back: Normal range of motion.  Skin:    General: Skin is warm and dry.     Findings: No rash.  Neurological:     Mental Status: She is alert.  Psychiatric:        Mood and Affect: Mood normal.      UC Treatments / Results  Labs (all labs ordered are listed, but only abnormal results are displayed) Labs Reviewed - No data to display  EKG   Radiology No results found.  Procedures Procedures (including critical care time)  Medications Ordered in UC Medications - No data to display  Initial Impression / Assessment and Plan / UC Course  I have reviewed the triage vital signs and the nursing notes.  Pertinent labs & imaging results that were available during my care of the patient were reviewed by me and considered in my medical decision making (see chart for details).     Wound check Table moved as requested from  patient's breast augmentation No concern for infection at this time. Follow up as needed for continued or worsening symptoms  Final Clinical Impressions(s) / UC Diagnoses   Final diagnoses:  Encounter for postoperative wound check     Discharge Instructions     Keep area clean and dry Follow up as needed for continued or worsening symptoms     ED Prescriptions    None     PDMP not reviewed this encounter.   Janace Aris, NP 07/19/20 1454

## 2020-12-18 ENCOUNTER — Emergency Department (HOSPITAL_COMMUNITY): Payer: Medicaid Other

## 2020-12-18 ENCOUNTER — Emergency Department (HOSPITAL_COMMUNITY)
Admission: EM | Admit: 2020-12-18 | Discharge: 2020-12-18 | Disposition: A | Discharge status: Home / Self Care | Payer: Medicaid Other | Attending: Emergency Medicine | Admitting: Emergency Medicine

## 2020-12-18 DIAGNOSIS — F10920 Alcohol use, unspecified with intoxication, uncomplicated: Secondary | ICD-10-CM

## 2020-12-18 DIAGNOSIS — Y908 Blood alcohol level of 240 mg/100 ml or more: Secondary | ICD-10-CM | POA: Insufficient documentation

## 2020-12-18 DIAGNOSIS — F10129 Alcohol abuse with intoxication, unspecified: Secondary | ICD-10-CM | POA: Diagnosis not present

## 2020-12-18 DIAGNOSIS — M62830 Muscle spasm of back: Secondary | ICD-10-CM | POA: Diagnosis not present

## 2020-12-18 DIAGNOSIS — Y92410 Unspecified street and highway as the place of occurrence of the external cause: Secondary | ICD-10-CM | POA: Insufficient documentation

## 2020-12-18 DIAGNOSIS — S40212A Abrasion of left shoulder, initial encounter: Secondary | ICD-10-CM | POA: Diagnosis not present

## 2020-12-18 DIAGNOSIS — S80211A Abrasion, right knee, initial encounter: Secondary | ICD-10-CM | POA: Diagnosis not present

## 2020-12-18 DIAGNOSIS — S4992XA Unspecified injury of left shoulder and upper arm, initial encounter: Secondary | ICD-10-CM | POA: Diagnosis present

## 2020-12-18 LAB — CBC WITH DIFFERENTIAL/PLATELET
Abs Immature Granulocytes: 0.03 10*3/uL (ref 0.00–0.07)
Basophils Absolute: 0 10*3/uL (ref 0.0–0.1)
Basophils Relative: 0 %
Eosinophils Absolute: 0 10*3/uL (ref 0.0–0.5)
Eosinophils Relative: 0 %
HCT: 38.9 % (ref 36.0–46.0)
Hemoglobin: 12.6 g/dL (ref 12.0–15.0)
Immature Granulocytes: 0 %
Lymphocytes Relative: 29 %
Lymphs Abs: 2.7 10*3/uL (ref 0.7–4.0)
MCH: 34.4 pg — ABNORMAL HIGH (ref 26.0–34.0)
MCHC: 32.4 g/dL (ref 30.0–36.0)
MCV: 106.3 fL — ABNORMAL HIGH (ref 80.0–100.0)
Monocytes Absolute: 0.5 10*3/uL (ref 0.1–1.0)
Monocytes Relative: 5 %
Neutro Abs: 6.1 10*3/uL (ref 1.7–7.7)
Neutrophils Relative %: 66 %
Platelets: 237 10*3/uL (ref 150–400)
RBC: 3.66 MIL/uL — ABNORMAL LOW (ref 3.87–5.11)
RDW: 12 % (ref 11.5–15.5)
WBC: 9.4 10*3/uL (ref 4.0–10.5)
nRBC: 0 % (ref 0.0–0.2)

## 2020-12-18 LAB — SAMPLE TO BLOOD BANK

## 2020-12-18 LAB — COMPREHENSIVE METABOLIC PANEL
ALT: 13 U/L (ref 0–44)
AST: 18 U/L (ref 15–41)
Albumin: 4.7 g/dL (ref 3.5–5.0)
Alkaline Phosphatase: 50 U/L (ref 38–126)
Anion gap: 17 — ABNORMAL HIGH (ref 5–15)
BUN: 8 mg/dL (ref 6–20)
CO2: 19 mmol/L — ABNORMAL LOW (ref 22–32)
Calcium: 9.7 mg/dL (ref 8.9–10.3)
Chloride: 108 mmol/L (ref 98–111)
Creatinine, Ser: 0.73 mg/dL (ref 0.44–1.00)
GFR, Estimated: >60 mL/min (ref >60)
Glucose, Bld: 96 mg/dL (ref 70–99)
Potassium: 3 mmol/L — ABNORMAL LOW (ref 3.5–5.1)
Sodium: 144 mmol/L (ref 135–145)
Total Bilirubin: 0.7 mg/dL (ref 0.3–1.2)
Total Protein: 8.2 g/dL — ABNORMAL HIGH (ref 6.5–8.1)

## 2020-12-18 LAB — RAPID URINE DRUG SCREEN, HOSP PERFORMED
Amphetamines: NOT DETECTED
Barbiturates: NOT DETECTED
Benzodiazepines: NOT DETECTED
Cocaine: NOT DETECTED
Opiates: NOT DETECTED
Tetrahydrocannabinol: POSITIVE — AB

## 2020-12-18 LAB — I-STAT CHEM 8, ED
BUN: 8 mg/dL (ref 6–20)
Calcium, Ion: 1.12 mmol/L — ABNORMAL LOW (ref 1.15–1.40)
Chloride: 109 mmol/L (ref 98–111)
Creatinine, Ser: 0.9 mg/dL (ref 0.44–1.00)
Glucose, Bld: 92 mg/dL (ref 70–99)
HCT: 40 % (ref 36.0–46.0)
Hemoglobin: 13.6 g/dL (ref 12.0–15.0)
Potassium: 2.9 mmol/L — ABNORMAL LOW (ref 3.5–5.1)
Sodium: 145 mmol/L (ref 135–145)
TCO2: 19 mmol/L — ABNORMAL LOW (ref 22–32)

## 2020-12-18 LAB — ETHANOL: Alcohol, Ethyl (B): 282 mg/dL — ABNORMAL HIGH (ref <10)

## 2020-12-18 LAB — I-STAT BETA HCG BLOOD, ED (MC, WL, AP ONLY): I-stat hCG, quantitative: <5 m[IU]/mL (ref <5)

## 2020-12-18 MED ORDER — BENZOCAINE 20 % MT AERO: INHALATION_SPRAY | OROMUCOSAL | Status: DC | PRN

## 2020-12-18 MED ORDER — METHOCARBAMOL 500 MG PO TABS: 500.0000 mg | ORAL_TABLET | Freq: Three times a day (TID) | ORAL | 0 refills | Status: DC | PRN

## 2020-12-18 MED ORDER — LIDOCAINE 5 % EX PTCH
1.0000 | MEDICATED_PATCH | CUTANEOUS | Status: DC
  Filled 2020-12-18: qty 1

## 2020-12-18 MED ORDER — METOCLOPRAMIDE HCL 5 MG/ML IJ SOLN
10.0000 mg | INTRAMUSCULAR | Status: AC
  Administered 2020-12-18: 10 mg via INTRAVENOUS
  Filled 2020-12-18: qty 2

## 2020-12-18 NOTE — Progress Notes (Signed)
Orthopedic Tech Progress Note Patient Details:  Mirela Parsley 02/19/1986 [id number] Level 2 Trauma  Patient ID: Lorelee Cover, female   DOB: 1986-03-07, 35 y.o.   MRN: [id number]   Genelle Bal Sim [place] 12/18/2020, 1:18 AM
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
## 2020-12-18 NOTE — Consult Note (Signed)
Responded to page, pt unavailable, no family present, staff will call again if further chaplain services needed.  Rev. Donnel Saxon Chaplain
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
## 2020-12-18 NOTE — ED Notes (Signed)
PT ambulated safely.

## 2020-12-18 NOTE — Discharge Instructions (Addendum)
Alternate ice and heat to areas of injury 3-4 times per day to limit inflammation and spasm.  Avoid strenuous activity and heavy lifting.  We recommend consistent use of naproxen in addition to Robaxin for muscle spasms. Do not drive or drink alcohol after taking Robaxin as it may make you drowsy and impair your judgment.  We recommend follow-up with a primary care doctor to ensure resolution of symptoms.  Return to the ED for any new or concerning symptoms. 

## 2021-01-22 ENCOUNTER — Ambulatory Visit (HOSPITAL_COMMUNITY): Payer: Self-pay

## 2021-01-28 ENCOUNTER — Encounter (HOSPITAL_COMMUNITY): Payer: Self-pay

## 2021-01-28 ENCOUNTER — Other Ambulatory Visit: Payer: Self-pay

## 2021-01-28 ENCOUNTER — Ambulatory Visit (HOSPITAL_COMMUNITY)
Admission: EM | Admit: 2021-01-28 | Discharge: 2021-01-28 | Disposition: A | Discharge status: Home / Self Care | Payer: Medicaid Other | Attending: Emergency Medicine | Admitting: Emergency Medicine

## 2021-01-28 DIAGNOSIS — N898 Other specified noninflammatory disorders of vagina: Secondary | ICD-10-CM | POA: Insufficient documentation

## 2021-01-28 DIAGNOSIS — M25561 Pain in right knee: Secondary | ICD-10-CM | POA: Insufficient documentation

## 2021-01-28 DIAGNOSIS — Z113 Encounter for screening for infections with a predominantly sexual mode of transmission: Secondary | ICD-10-CM | POA: Insufficient documentation

## 2021-01-28 MED ORDER — IBUPROFEN 600 MG PO TABS: 600.0000 mg | ORAL_TABLET | Freq: Four times a day (QID) | ORAL | 0 refills | Status: DC | PRN

## 2021-01-28 MED ORDER — METRONIDAZOLE 500 MG PO TABS: 500.0000 mg | ORAL_TABLET | Freq: Two times a day (BID) | ORAL | 0 refills | Status: DC
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
## 2021-01-28 NOTE — Discharge Instructions (Addendum)
Take metronidazole twice a day for 7 days.    Your vaginal tests are pending.  If your test results are positive, we will call you.  You and your sexual partner(s) may require treatment at that time.  Do not have sexual activity for at least 7 days.    Take the ibuprofen as directed for your knee pain.  Schedule an appointment with an orthopedist such as the one listed below.
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
## 2021-01-29 LAB — CERVICOVAGINAL ANCILLARY ONLY
Bacterial Vaginitis (gardnerella): POSITIVE — AB
Candida Glabrata: NEGATIVE
Candida Vaginitis: NEGATIVE
Chlamydia: NEGATIVE
Comment: NEGATIVE
Comment: NEGATIVE
Comment: NEGATIVE
Comment: NEGATIVE
Comment: NEGATIVE
Comment: NORMAL
Neisseria Gonorrhea: NEGATIVE
Trichomonas: NEGATIVE
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
## 2021-07-01 ENCOUNTER — Encounter (HOSPITAL_COMMUNITY): Payer: Self-pay | Admitting: Emergency Medicine

## 2021-07-01 ENCOUNTER — Ambulatory Visit (HOSPITAL_COMMUNITY)
Admission: EM | Admit: 2021-07-01 | Discharge: 2021-07-01 | Disposition: A | Discharge status: Home / Self Care | Payer: Medicaid Other | Attending: Physician Assistant | Admitting: Physician Assistant

## 2021-07-01 ENCOUNTER — Other Ambulatory Visit: Payer: Self-pay

## 2021-07-01 DIAGNOSIS — N898 Other specified noninflammatory disorders of vagina: Secondary | ICD-10-CM | POA: Diagnosis present

## 2021-07-01 DIAGNOSIS — Z113 Encounter for screening for infections with a predominantly sexual mode of transmission: Secondary | ICD-10-CM | POA: Diagnosis not present

## 2021-07-01 LAB — POC URINE PREG, ED: Preg Test, Ur: NEGATIVE

## 2021-07-01 MED ORDER — METRONIDAZOLE 500 MG PO TABS: 500.0000 mg | ORAL_TABLET | Freq: Two times a day (BID) | ORAL | 0 refills | Status: DC

## 2021-07-01 NOTE — ED Triage Notes (Signed)
Pt is present toady with vaginal irration, vaginal discharge, and lower abdominal pain. Pt states that her sx started x3 days ago

## 2021-07-01 NOTE — ED Provider Notes (Signed)
[facility]    CSN: [id number] Arrival date & time: 07/01/21  1704      History   Chief Complaint Chief Complaint  Patient presents with   Abdominal Pain   Vaginitis    HPI Megan Nolan is a 35 y.o. female.   Patient presents today with a 3-day history of vaginal discharge.  She describes this as copious, watery, white, malodorous.  She does have a history of bacterial vaginosis and states current symptoms are similar to previous episodes of this condition.  She reports some abdominal cramping but denies any pelvic pain.  Denies any vaginal discharge, abnormal vaginal bleeding, fever, nausea, vomiting, urinary symptoms.  She has not tried any over-the-counter medication for symptom management.  She is sexually active but denies any new partners but is open to STI testing.  Denies any recent antibiotic use or medication changes.  Denies any changes to her personal hygiene products including soaps or detergents.   Past Medical History:  Diagnosis Date   Abnormal Pap smear    Bacterial vaginosis    Medical history non-contributory    No pertinent past medical history     There are no problems to display for this patient.   Past Surgical History:  Procedure Laterality Date   CERVIX LESION DESTRUCTION     WISDOM TOOTH EXTRACTION      OB History     Gravida  3   Para  1   Term  1   Preterm      AB  1   Living  1      SAB  1   IAB      Ectopic      Multiple      Live Births  1            Home Medications    Prior to Admission medications   Medication Sig Start Date End Date Taking? Authorizing Provider  metroNIDAZOLE (FLAGYL) 500 MG tablet Take 1 tablet (500 mg total) by mouth 2 (two) times daily. 07/01/21   Payden Docter, Noberto Retort, PA-C    Family History Family History  Problem Relation Age of Onset   Cancer Maternal Grandmother     Social History Social History   Tobacco Use   Smoking status: Former    Types: Cigarettes     Quit date: 04/01/2012    Years since quitting: 9.2   Smokeless tobacco: Never  Substance Use Topics   Alcohol use: Yes    Comment: occasionally   Drug use: No     Allergies   Patient has no known allergies.   Review of Systems Review of Systems  Constitutional:  Negative for activity change, appetite change, fatigue and fever.  Respiratory:  Negative for cough and shortness of breath.   Cardiovascular:  Negative for chest pain.  Gastrointestinal:  Negative for abdominal pain, diarrhea, nausea and vomiting.  Genitourinary:  Positive for vaginal discharge. Negative for vaginal bleeding and vaginal pain.  Musculoskeletal:  Negative for arthralgias, back pain and myalgias.  Neurological:  Negative for dizziness, light-headedness and headaches.    Physical Exam Triage Vital Signs ED Triage Vitals  Enc Vitals Group     BP 07/01/21 1754 126/77     Pulse Rate 07/01/21 1754 86     Resp 07/01/21 1754 18     Temp 07/01/21 1754 98.9 F (37.2 C)     Temp src --      SpO2 07/01/21 1754 100 %  Weight --      Height --      Head Circumference --      Peak Flow --      Pain Score 07/01/21 1757 7     Pain Loc --      Pain Edu? --      Excl. in GC? --    No data found.  Updated Vital Signs BP 126/77   Pulse 86   Temp 98.9 F (37.2 C)   Resp 18   SpO2 100%   Visual Acuity Right Eye Distance:   Left Eye Distance:   Bilateral Distance:    Right Eye Near:   Left Eye Near:    Bilateral Near:     Physical Exam Vitals reviewed.  Constitutional:      General: She is awake. She is not in acute distress.    Appearance: Normal appearance. She is normal weight. She is not ill-appearing.     Comments: Very pleasant female appears stated age in no acute distress sitting comfortably in exam room  HENT:     Head: Normocephalic and atraumatic.  Cardiovascular:     Rate and Rhythm: Normal rate and regular rhythm.     Heart sounds: Normal heart sounds, S1 normal and S2 normal.  No murmur heard. Pulmonary:     Effort: Pulmonary effort is normal.     Breath sounds: Normal breath sounds. No wheezing, rhonchi or rales.     Comments: Clear to auscultation bilaterally Abdominal:     General: Bowel sounds are normal.     Palpations: Abdomen is soft.     Tenderness: There is no abdominal tenderness. There is no right CVA tenderness, left CVA tenderness, guarding or rebound.     Comments: Benign abdominal exam  Genitourinary:    Comments: Exam deferred Psychiatric:        Behavior: Behavior is cooperative.     UC Treatments / Results  Labs (all labs ordered are listed, but only abnormal results are displayed) Labs Reviewed  POC URINE PREG, ED  CERVICOVAGINAL ANCILLARY ONLY    EKG   Radiology No results found.  Procedures Procedures (including critical care time)  Medications Ordered in UC Medications - No data to display  Initial Impression / Assessment and Plan / UC Course  I have reviewed the triage vital signs and the nursing notes.  Pertinent labs & imaging results that were available during my care of the patient were reviewed by me and considered in my medical decision making (see chart for details).      Will empirically treat for bacterial vaginosis.  Urine pregnancy test was negative today.  STI swab was collected-results pending.  Patient was started on metronidazole 500 mg twice daily for 1 week.  Discussed that she is not to drink any alcohol while taking this medication and for 72 hours after completing course due to Antabuse side effects.  Recommended she use hypoallergenic soaps and detergents and wear loosefitting cotton underwear.  Discussed alarm symptoms that warrant emergent evaluation.  Strict return precautions given to which patient expressed understanding.  Final Clinical Impressions(s) / UC Diagnoses   Final diagnoses:  Vaginal discharge  Routine screening for STI (sexually transmitted infection)     Discharge  Instructions      Take metronidazole twice daily for 7 days.  This can make you vomit if you are drinking alcohol so please do not drink any alcohol while on this medication and for 72 hours after completing course.  Use hypoallergenic soaps and detergents.  We will contact you with your swab results if we need to arrange additional treatment.  If anything worsens please return for reevaluation.     ED Prescriptions     Medication Sig Dispense Auth. Provider   metroNIDAZOLE (FLAGYL) 500 MG tablet Take 1 tablet (500 mg total) by mouth 2 (two) times daily. 14 tablet Constantin Hillery, Noberto Retort, PA-C      PDMP not reviewed this encounter.   Jeani Hawking, PA-C 07/01/21 1925

## 2021-07-01 NOTE — Discharge Instructions (Addendum)
Take metronidazole twice daily for 7 days.  This can make you vomit if you are drinking alcohol so please do not drink any alcohol while on this medication and for 72 hours after completing course.  Use hypoallergenic soaps and detergents.  We will contact you with your swab results if we need to arrange additional treatment.  If anything worsens please return for reevaluation.

## 2021-07-02 LAB — CERVICOVAGINAL ANCILLARY ONLY
Bacterial Vaginitis (gardnerella): POSITIVE — AB
Candida Glabrata: NEGATIVE
Candida Vaginitis: NEGATIVE
Chlamydia: NEGATIVE
Comment: NEGATIVE
Comment: NEGATIVE
Comment: NEGATIVE
Comment: NEGATIVE
Comment: NEGATIVE
Comment: NORMAL
Neisseria Gonorrhea: NEGATIVE
Trichomonas: POSITIVE — AB

## 2021-09-30 ENCOUNTER — Other Ambulatory Visit: Payer: Self-pay

## 2021-09-30 ENCOUNTER — Ambulatory Visit (HOSPITAL_COMMUNITY)
Admission: EM | Admit: 2021-09-30 | Discharge: 2021-09-30 | Disposition: A | Discharge status: Home / Self Care | Payer: Medicaid Other | Attending: Internal Medicine | Admitting: Internal Medicine

## 2021-09-30 ENCOUNTER — Encounter (HOSPITAL_COMMUNITY): Payer: Self-pay | Admitting: Emergency Medicine

## 2021-09-30 DIAGNOSIS — Z113 Encounter for screening for infections with a predominantly sexual mode of transmission: Secondary | ICD-10-CM | POA: Insufficient documentation

## 2021-09-30 DIAGNOSIS — N898 Other specified noninflammatory disorders of vagina: Secondary | ICD-10-CM | POA: Insufficient documentation

## 2021-09-30 MED ORDER — METRONIDAZOLE 500 MG PO TABS: 500.0000 mg | ORAL_TABLET | Freq: Two times a day (BID) | ORAL | 0 refills | Status: AC

## 2021-09-30 NOTE — ED Triage Notes (Signed)
Vaginal discharge, odor and itchy.  Patient reports symptoms for 4 days

## 2021-09-30 NOTE — ED Notes (Signed)
Patient's vaginal swab was sent to pharmacy

## 2021-09-30 NOTE — Discharge Instructions (Addendum)
I went ahead and sent over prescription to treat bacterial vaginosis since her symptoms are consistent with this.  Further testing will take 2 to 3 days to come back.  Someone from the clinic will reach out to you should you require any further testing.

## 2021-09-30 NOTE — ED Provider Notes (Signed)
[facility]    CSN: [id number] Arrival date & time: 09/30/21  1343      History   Chief Complaint Chief Complaint  Patient presents with   Vaginal Discharge    HPI Megan Nolan is a 35 y.o. female otherwise healthy presents to urgent care with complaints of vaginal discharge.  Patient states symptoms ongoing for 4 days describing thick white discharge with "fishy" odor. patient states symptoms are similar to previous BV infections.  She reports occasional lower abdominal cramping though no abdominal pain, nausea, vomiting, no recent fever or chills, no dysuria.  Patient requesting complete STI screening.  On Depo injections for birth control   Past Medical History:  Diagnosis Date   Abnormal Pap smear    Bacterial vaginosis    Medical history non-contributory    No pertinent past medical history     There are no problems to display for this patient.   Past Surgical History:  Procedure Laterality Date   CERVIX LESION DESTRUCTION     WISDOM TOOTH EXTRACTION      OB History     Gravida  3   Para  1   Term  1   Preterm      AB  1   Living  1      SAB  1   IAB      Ectopic      Multiple      Live Births  1            Home Medications    Prior to Admission medications   Medication Sig Start Date End Date Taking? Authorizing Provider  metroNIDAZOLE (FLAGYL) 500 MG tablet Take 1 tablet (500 mg total) by mouth 2 (two) times daily for 7 days. 09/30/21 10/07/21 Yes Rolla Etienne, NP    Family History Family History  Problem Relation Age of Onset   Cancer Maternal Grandmother     Social History Social History   Tobacco Use   Smoking status: Former    Types: Cigarettes    Quit date: 04/01/2012    Years since quitting: 9.5   Smokeless tobacco: Never  Substance Use Topics   Alcohol use: Yes    Comment: occasionally   Drug use: Yes    Types: Marijuana     Allergies   Patient has no known allergies.   Review of  Systems As stated in HPI otherwise negative   Physical Exam Triage Vital Signs ED Triage Vitals  Enc Vitals Group     BP 09/30/21 1533 120/78     Pulse Rate 09/30/21 1533 92     Resp 09/30/21 1533 18     Temp 09/30/21 1533 99.7 F (37.6 C)     Temp Source 09/30/21 1533 Oral     SpO2 09/30/21 1533 95 %     Weight --      Height --      Head Circumference --      Peak Flow --      Pain Score 09/30/21 1530 0     Pain Loc --      Pain Edu? --      Excl. in GC? --    No data found.  Updated Vital Signs BP 120/78 (BP Location: Right Arm)   Pulse 92   Temp 99.7 F (37.6 C) (Oral)   Resp 18   SpO2 95%   Visual Acuity Right Eye Distance:   Left Eye Distance:   Bilateral  Distance:    Right Eye Near:   Left Eye Near:    Bilateral Near:     Physical Exam Constitutional:      General: She is not in acute distress.    Appearance: Normal appearance. She is normal weight. She is not ill-appearing or toxic-appearing.  Eyes:     Extraocular Movements: Extraocular movements intact.  Cardiovascular:     Rate and Rhythm: Normal rate and regular rhythm.  Pulmonary:     Effort: Pulmonary effort is normal.     Breath sounds: Normal breath sounds.  Abdominal:     General: Bowel sounds are normal.     Palpations: Abdomen is soft.  Musculoskeletal:     Cervical back: Normal range of motion.  Skin:    General: Skin is warm and dry.  Neurological:     General: No focal deficit present.     Mental Status: She is alert and oriented to person, place, and time.  Psychiatric:        Mood and Affect: Mood normal.        Behavior: Behavior normal.     UC Treatments / Results  Labs (all labs ordered are listed, but only abnormal results are displayed) Labs Reviewed  CERVICOVAGINAL ANCILLARY ONLY    EKG   Radiology No results found.  Procedures Procedures (including critical care time)  Medications Ordered in UC Medications - No data to display  Initial Impression /  Assessment and Plan / UC Course  I have reviewed the triage vital signs and the nursing notes.  Pertinent labs & imaging results that were available during my care of the patient were reviewed by me and considered in my medical decision making (see chart for details).  Vaginal discharge STI screening  -We will go ahead and treat empirically for bacterial vaginosis as patient reports symptoms consistent with previous infections -Metronidazole twice daily x7 days -Complete STI screening sent -Follow-up for any worsening or persistent symptoms  Reviewed expections re: course of current medical issues. Questions answered. Outlined signs and symptoms indicating need for more acute intervention. Pt verbalized understanding. AVS given  Final Clinical Impressions(s) / UC Diagnoses   Final diagnoses:  Vaginal discharge  Routine screening for STI (sexually transmitted infection)     Discharge Instructions      I went ahead and sent over prescription to treat bacterial vaginosis since her symptoms are consistent with this.  Further testing will take 2 to 3 days to come back.  Someone from the clinic will reach out to you should you require any further testing.     ED Prescriptions     Medication Sig Dispense Auth. Provider   metroNIDAZOLE (FLAGYL) 500 MG tablet Take 1 tablet (500 mg total) by mouth 2 (two) times daily for 7 days. 30 tablet Rolla Etienne, NP      PDMP not reviewed this encounter.   Rolla Etienne, NP 09/30/21 [phone]

## 2021-10-02 ENCOUNTER — Telehealth (HOSPITAL_COMMUNITY): Payer: Self-pay | Admitting: Emergency Medicine

## 2021-10-02 LAB — CERVICOVAGINAL ANCILLARY ONLY
Bacterial Vaginitis (gardnerella): POSITIVE — AB
Candida Glabrata: NEGATIVE
Candida Vaginitis: POSITIVE — AB
Chlamydia: NEGATIVE
Comment: NEGATIVE
Comment: NEGATIVE
Comment: NEGATIVE
Comment: NEGATIVE
Comment: NEGATIVE
Comment: NORMAL
Neisseria Gonorrhea: NEGATIVE
Trichomonas: NEGATIVE

## 2021-10-02 MED ORDER — FLUCONAZOLE 150 MG PO TABS: 150.0000 mg | ORAL_TABLET | Freq: Once | ORAL | 0 refills | Status: AC

## 2022-11-08 ENCOUNTER — Other Ambulatory Visit: Payer: Self-pay

## 2022-11-08 ENCOUNTER — Other Ambulatory Visit: Payer: Self-pay | Admitting: Obstetrics & Gynecology

## 2022-11-08 ENCOUNTER — Encounter (HOSPITAL_COMMUNITY): Payer: Self-pay | Admitting: *Deleted

## 2022-11-08 ENCOUNTER — Inpatient Hospital Stay (HOSPITAL_COMMUNITY): Payer: Medicaid Other

## 2022-11-08 ENCOUNTER — Encounter (HOSPITAL_COMMUNITY)
Admission: AD | Disposition: A | Discharge status: Home / Self Care | Payer: Self-pay | Source: Home / Self Care | Attending: Family Medicine

## 2022-11-08 ENCOUNTER — Inpatient Hospital Stay (EMERGENCY_DEPARTMENT_HOSPITAL): Payer: Medicaid Other | Admitting: Anesthesiology

## 2022-11-08 ENCOUNTER — Inpatient Hospital Stay (HOSPITAL_COMMUNITY): Payer: Medicaid Other | Admitting: Anesthesiology

## 2022-11-08 ENCOUNTER — Inpatient Hospital Stay (HOSPITAL_COMMUNITY)
Admission: AD | Admit: 2022-11-08 | Discharge: 2022-11-08 | Disposition: A | Discharge status: Home / Self Care | Payer: Medicaid Other | Attending: Family Medicine | Admitting: Family Medicine

## 2022-11-08 DIAGNOSIS — Z3A01 Less than 8 weeks gestation of pregnancy: Secondary | ICD-10-CM

## 2022-11-08 DIAGNOSIS — O00101 Right tubal pregnancy without intrauterine pregnancy: Secondary | ICD-10-CM

## 2022-11-08 DIAGNOSIS — O039 Complete or unspecified spontaneous abortion without complication: Secondary | ICD-10-CM | POA: Diagnosis not present

## 2022-11-08 DIAGNOSIS — O09521 Supervision of elderly multigravida, first trimester: Secondary | ICD-10-CM | POA: Diagnosis not present

## 2022-11-08 DIAGNOSIS — O081 Delayed or excessive hemorrhage following ectopic and molar pregnancy: Secondary | ICD-10-CM

## 2022-11-08 DIAGNOSIS — O009 Unspecified ectopic pregnancy without intrauterine pregnancy: Secondary | ICD-10-CM | POA: Diagnosis not present

## 2022-11-08 DIAGNOSIS — Z3A00 Weeks of gestation of pregnancy not specified: Secondary | ICD-10-CM

## 2022-11-08 DIAGNOSIS — K661 Hemoperitoneum: Secondary | ICD-10-CM | POA: Diagnosis not present

## 2022-11-08 HISTORY — PX: DIAGNOSTIC LAPAROSCOPY WITH REMOVAL OF ECTOPIC PREGNANCY: SHX6449

## 2022-11-08 LAB — URINALYSIS, ROUTINE W REFLEX MICROSCOPIC
Bilirubin Urine: NEGATIVE
Glucose, UA: NEGATIVE mg/dL
Ketones, ur: 20 mg/dL — AB
Leukocytes,Ua: NEGATIVE
Nitrite: NEGATIVE
Protein, ur: NEGATIVE mg/dL
Specific Gravity, Urine: 1.018 (ref 1.005–1.030)
pH: 5 (ref 5.0–8.0)

## 2022-11-08 LAB — CBC WITH DIFFERENTIAL/PLATELET
Abs Immature Granulocytes: 0.04 10*3/uL (ref 0.00–0.07)
Basophils Absolute: 0 10*3/uL (ref 0.0–0.1)
Basophils Relative: 0 %
Eosinophils Absolute: 0 10*3/uL (ref 0.0–0.5)
Eosinophils Relative: 0 %
HCT: 30.3 % — ABNORMAL LOW (ref 36.0–46.0)
Hemoglobin: 10.6 g/dL — ABNORMAL LOW (ref 12.0–15.0)
Immature Granulocytes: 0 %
Lymphocytes Relative: 13 %
Lymphs Abs: 1.5 10*3/uL (ref 0.7–4.0)
MCH: 36.1 pg — ABNORMAL HIGH (ref 26.0–34.0)
MCHC: 35 g/dL (ref 30.0–36.0)
MCV: 103.1 fL — ABNORMAL HIGH (ref 80.0–100.0)
Monocytes Absolute: 0.5 10*3/uL (ref 0.1–1.0)
Monocytes Relative: 4 %
Neutro Abs: 9.5 10*3/uL — ABNORMAL HIGH (ref 1.7–7.7)
Neutrophils Relative %: 83 %
Platelets: 262 10*3/uL (ref 150–400)
RBC: 2.94 MIL/uL — ABNORMAL LOW (ref 3.87–5.11)
RDW: 11.6 % (ref 11.5–15.5)
WBC: 11.7 10*3/uL — ABNORMAL HIGH (ref 4.0–10.5)
nRBC: 0 % (ref 0.0–0.2)

## 2022-11-08 LAB — TYPE AND SCREEN
ABO/RH(D): O POS
Antibody Screen: NEGATIVE

## 2022-11-08 LAB — HCG, QUANTITATIVE, PREGNANCY: hCG, Beta Chain, Quant, S: 985 m[IU]/mL — ABNORMAL HIGH (ref <5)

## 2022-11-08 LAB — POCT PREGNANCY, URINE: Preg Test, Ur: POSITIVE — AB

## 2022-11-08 SURGERY — LAPAROSCOPY, WITH ECTOPIC PREGNANCY SURGICAL TREATMENT
Anesthesia: General | Site: Abdomen

## 2022-11-08 MED ORDER — ONDANSETRON HCL 4 MG/2ML IJ SOLN
INTRAMUSCULAR | Status: AC
  Filled 2022-11-08: qty 2

## 2022-11-08 MED ORDER — CEFAZOLIN SODIUM-DEXTROSE 2-3 GM-%(50ML) IV SOLR
INTRAVENOUS | Status: DC | PRN
  Administered 2022-11-08: 2 g via INTRAVENOUS

## 2022-11-08 MED ORDER — KETOROLAC TROMETHAMINE 30 MG/ML IJ SOLN
INTRAMUSCULAR | Status: DC | PRN
  Administered 2022-11-08: 30 mg via INTRAVENOUS

## 2022-11-08 MED ORDER — HYDROCODONE-ACETAMINOPHEN 5-325 MG PO TABS: 1.0000 | ORAL_TABLET | Freq: Four times a day (QID) | ORAL | 0 refills | Status: DC | PRN

## 2022-11-08 MED ORDER — SUCCINYLCHOLINE CHLORIDE 200 MG/10ML IV SOSY
PREFILLED_SYRINGE | INTRAVENOUS | Status: DC | PRN
  Administered 2022-11-08: 120 mg via INTRAVENOUS

## 2022-11-08 MED ORDER — CEFAZOLIN SODIUM-DEXTROSE 2-4 GM/100ML-% IV SOLN
INTRAVENOUS | Status: AC
  Filled 2022-11-08: qty 100

## 2022-11-08 MED ORDER — MIDAZOLAM HCL 2 MG/2ML IJ SOLN
INTRAMUSCULAR | Status: AC
Start: 2022-11-08 — End: ?
  Filled 2022-11-08: qty 2

## 2022-11-08 MED ORDER — SODIUM CHLORIDE 0.9 % IV SOLN
8.0000 mg | Freq: Once | INTRAVENOUS | Status: AC
  Administered 2022-11-08: 8 mg via INTRAVENOUS
  Filled 2022-11-08: qty 4

## 2022-11-08 MED ORDER — PROPOFOL 10 MG/ML IV BOLUS
INTRAVENOUS | Status: AC
Start: 2022-11-08 — End: ?
  Filled 2022-11-08: qty 20

## 2022-11-08 MED ORDER — ROCURONIUM BROMIDE 10 MG/ML (PF) SYRINGE
PREFILLED_SYRINGE | INTRAVENOUS | Status: DC | PRN
  Administered 2022-11-08: 50 mg via INTRAVENOUS

## 2022-11-08 MED ORDER — LACTATED RINGERS IV BOLUS
1000.0000 mL | Freq: Once | INTRAVENOUS | Status: AC
Start: 2022-11-08 — End: 2022-11-08
  Administered 2022-11-08: 1000 mL via INTRAVENOUS

## 2022-11-08 MED ORDER — DEXAMETHASONE SODIUM PHOSPHATE 10 MG/ML IJ SOLN
INTRAMUSCULAR | Status: AC
  Filled 2022-11-08: qty 1

## 2022-11-08 MED ORDER — FENTANYL CITRATE (PF) 250 MCG/5ML IJ SOLN
INTRAMUSCULAR | Status: DC | PRN
  Administered 2022-11-08: 50 ug via INTRAVENOUS
  Administered 2022-11-08: 100 ug via INTRAVENOUS
  Administered 2022-11-08: 50 ug via INTRAVENOUS

## 2022-11-08 MED ORDER — KETOROLAC TROMETHAMINE 10 MG PO TABS: 10.0000 mg | ORAL_TABLET | Freq: Three times a day (TID) | ORAL | 0 refills | Status: DC | PRN

## 2022-11-08 MED ORDER — DEXAMETHASONE SODIUM PHOSPHATE 10 MG/ML IJ SOLN
INTRAMUSCULAR | Status: DC | PRN
  Administered 2022-11-08: 10 mg via INTRAVENOUS

## 2022-11-08 MED ORDER — BUPIVACAINE LIPOSOME 1.3 % IJ SUSP
INTRAMUSCULAR | Status: DC | PRN
  Administered 2022-11-08: 20 mL

## 2022-11-08 MED ORDER — SUCCINYLCHOLINE CHLORIDE 200 MG/10ML IV SOSY
PREFILLED_SYRINGE | INTRAVENOUS | Status: AC
  Filled 2022-11-08: qty 10

## 2022-11-08 MED ORDER — LIDOCAINE 2% (20 MG/ML) 5 ML SYRINGE
INTRAMUSCULAR | Status: DC | PRN
  Administered 2022-11-08: 10 mg via INTRAVENOUS

## 2022-11-08 MED ORDER — LACTATED RINGERS IV SOLN: INTRAVENOUS | Status: DC | PRN

## 2022-11-08 MED ORDER — BUPIVACAINE LIPOSOME 1.3 % IJ SUSP
INTRAMUSCULAR | Status: AC
  Filled 2022-11-08: qty 20

## 2022-11-08 MED ORDER — ONDANSETRON 8 MG PO TBDP: 8.0000 mg | ORAL_TABLET | Freq: Three times a day (TID) | ORAL | 0 refills | Status: DC | PRN

## 2022-11-08 MED ORDER — ROCURONIUM BROMIDE 10 MG/ML (PF) SYRINGE
PREFILLED_SYRINGE | INTRAVENOUS | Status: AC
  Filled 2022-11-08: qty 10

## 2022-11-08 MED ORDER — SODIUM CHLORIDE 0.9 % IR SOLN
Status: DC | PRN
  Administered 2022-11-08 (×2): 1000 mL

## 2022-11-08 MED ORDER — KETOROLAC TROMETHAMINE 30 MG/ML IJ SOLN
INTRAMUSCULAR | Status: AC
  Filled 2022-11-08: qty 1

## 2022-11-08 MED ORDER — LIDOCAINE 2% (20 MG/ML) 5 ML SYRINGE
INTRAMUSCULAR | Status: AC
  Filled 2022-11-08: qty 5

## 2022-11-08 MED ORDER — PROPOFOL 10 MG/ML IV BOLUS
INTRAVENOUS | Status: DC | PRN
  Administered 2022-11-08: 140 mg via INTRAVENOUS

## 2022-11-08 MED ORDER — ONDANSETRON HCL 4 MG/2ML IJ SOLN
INTRAMUSCULAR | Status: DC | PRN
  Administered 2022-11-08: 4 mg via INTRAVENOUS

## 2022-11-08 MED ORDER — FENTANYL CITRATE (PF) 250 MCG/5ML IJ SOLN
INTRAMUSCULAR | Status: AC
  Filled 2022-11-08: qty 5

## 2022-11-08 MED ORDER — HYDROMORPHONE HCL 1 MG/ML IJ SOLN
1.0000 mg | Freq: Once | INTRAMUSCULAR | Status: AC
  Administered 2022-11-08: 1 mg via INTRAVENOUS
  Filled 2022-11-08: qty 1

## 2022-11-08 MED ORDER — PROPOFOL 10 MG/ML IV BOLUS
INTRAVENOUS | Status: AC
  Filled 2022-11-08: qty 20

## 2022-11-08 SURGICAL SUPPLY — 45 items
ADH SKN CLS APL DERMABOND .7 (GAUZE/BANDAGES/DRESSINGS) ×1
BAG SPEC RTRVL LRG 6X4 10 (ENDOMECHANICALS)
BARRIER ADHS 3X4 INTERCEED (GAUZE/BANDAGES/DRESSINGS) IMPLANT
BRR ADH 4X3 ABS CNTRL BYND (GAUZE/BANDAGES/DRESSINGS)
COVER MAYO STAND STRL (DRAPES) ×2 IMPLANT
DERMABOND ADVANCED .7 DNX12 (GAUZE/BANDAGES/DRESSINGS) IMPLANT
DRSG OPSITE POSTOP 3X4 (GAUZE/BANDAGES/DRESSINGS) ×2 IMPLANT
DRSG TEGADERM 2-3/8X2-3/4 SM (GAUZE/BANDAGES/DRESSINGS) IMPLANT
ELECT REM PT RETURN 9FT ADLT (ELECTROSURGICAL) ×1
ELECTRODE REM PT RTRN 9FT ADLT (ELECTROSURGICAL) ×2 IMPLANT
FILTER SMOKE EVAC LAPAROSHD (FILTER) IMPLANT
GLOVE BIOGEL PI IND STRL 7.0 (GLOVE) ×2 IMPLANT
GLOVE BIOGEL PI IND STRL 8 (GLOVE) ×2 IMPLANT
GLOVE ECLIPSE 8.0 STRL XLNG CF (GLOVE) ×2 IMPLANT
GOWN STRL REUS W/ TWL LRG LVL3 (GOWN DISPOSABLE) ×4 IMPLANT
GOWN STRL REUS W/TWL LRG LVL3 (GOWN DISPOSABLE) ×2
IRRIG SUCT STRYKERFLOW 2 WTIP (MISCELLANEOUS)
IRRIGATION SUCT STRKRFLW 2 WTP (MISCELLANEOUS) IMPLANT
NDL INSUFFLATION 14GA 120MM (NEEDLE) ×2 IMPLANT
NEEDLE INSUFFLATION 14GA 120MM (NEEDLE) ×1 IMPLANT
PACK LAPAROSCOPY BASIN (CUSTOM PROCEDURE TRAY) ×2 IMPLANT
PACK TRENDGUARD 450 HYBRID PRO (MISCELLANEOUS) IMPLANT
PACK VAGINAL WOMENS (CUSTOM PROCEDURE TRAY) IMPLANT
POUCH SPECIMEN RETRIEVAL 10MM (ENDOMECHANICALS) IMPLANT
PROTECTOR NERVE ULNAR (MISCELLANEOUS) ×4 IMPLANT
SHEARS HARMONIC ACE PLUS 36CM (ENDOMECHANICALS) IMPLANT
SLEEVE ENDOPATH XCEL 5M (ENDOMECHANICALS) ×2 IMPLANT
SLEEVE Z-THREAD 5X100MM (TROCAR) IMPLANT
SPIKE FLUID TRANSFER (MISCELLANEOUS) ×2 IMPLANT
SPONGE GAUZE 2X2 8PLY STRL LF (GAUZE/BANDAGES/DRESSINGS) IMPLANT
STAPLER VISISTAT 35W (STAPLE) IMPLANT
SUT VIC AB 3-0 FS2 27 (SUTURE) IMPLANT
SUT VIC AB 3-0 SH 27 (SUTURE)
SUT VIC AB 3-0 SH 27X BRD (SUTURE) IMPLANT
SUT VICRYL 0 UR6 27IN ABS (SUTURE) ×2 IMPLANT
SYS BAG RETRIEVAL 10MM (BASKET) ×1
SYSTEM BAG RETRIEVAL 10MM (BASKET) IMPLANT
TOWEL GREEN STERILE FF (TOWEL DISPOSABLE) ×4 IMPLANT
TRAY FOLEY W/BAG SLVR 14FR (SET/KITS/TRAYS/PACK) ×2 IMPLANT
TRENDGUARD 450 HYBRID PRO PACK (MISCELLANEOUS)
TROCAR 11X100 Z THREAD (TROCAR) IMPLANT
TROCAR BALLN 12MMX100 BLUNT (TROCAR) IMPLANT
TROCAR XCEL NON-BLD 11X100MML (ENDOMECHANICALS) ×2 IMPLANT
TROCAR XCEL NON-BLD 5MMX100MML (ENDOMECHANICALS) ×2 IMPLANT
TROCAR Z-THREAD OPTICAL 5X100M (TROCAR) IMPLANT

## 2022-11-08 NOTE — H&P (Signed)
Preoperative History and Physical  Megan Nolan is a 36 y.o. [phone] with Patient's last menstrual period was 10/05/2022. admitted for a right ectopic pregnancy ruptured, for laparoscopic management, likely right salpingectomy.  Received orally methotrexate 3 days ago in [place] Sonogram here is reviewed and reveals blood in the belly and right adnexal mass with yolk sac  PMH:    Past Medical History:  Diagnosis Date   Abnormal Pap smear    Bacterial vaginosis    Medical history non-contributory    No pertinent past medical history     PSH:     Past Surgical History:  Procedure Laterality Date   CERVIX LESION DESTRUCTION     WISDOM TOOTH EXTRACTION      POb/GynH:      OB History     Gravida  3   Para  1   Term  1   Preterm      AB  1   Living  1      SAB  0   IAB  1   Ectopic      Multiple      Live Births  1           SH:   Social History   Tobacco Use   Smoking status: Former    Types: Cigarettes    Quit date: 04/01/2012    Years since quitting: 10.6   Smokeless tobacco: Never  Substance Use Topics   Alcohol use: Not Currently    Comment: occasionally   Drug use: Not Currently    Types: Marijuana    FH:    Family History  Problem Relation Age of Onset   Cancer Maternal Grandmother      Allergies: No Known Allergies  Medications:      No current facility-administered medications for this encounter.  Review of Systems:   Review of Systems  Constitutional: Negative for fever, chills, weight loss, malaise/fatigue and diaphoresis.  HENT: Negative for hearing loss, ear pain, nosebleeds, congestion, sore throat, neck pain, tinnitus and ear discharge.   Eyes: Negative for blurred vision, double vision, photophobia, pain, discharge and redness.  Respiratory: Negative for cough, hemoptysis, sputum production, shortness of breath, wheezing and stridor.   Cardiovascular: Negative for chest pain, palpitations, orthopnea, claudication,  leg swelling and PND.  Gastrointestinal: Positive for abdominal pain. Negative for heartburn, nausea, vomiting, diarrhea, constipation, blood in stool and melena.  Genitourinary: Negative for dysuria, urgency, frequency, hematuria and flank pain.  Musculoskeletal: Negative for myalgias, back pain, joint pain and falls.  Skin: Negative for itching and rash.  Neurological: Negative for dizziness, tingling, tremors, sensory change, speech change, focal weakness, seizures, loss of consciousness, weakness and headaches.  Endo/Heme/Allergies: Negative for environmental allergies and polydipsia. Does not bruise/bleed easily.  Psychiatric/Behavioral: Negative for depression, suicidal ideas, hallucinations, memory loss and substance abuse. The patient is not nervous/anxious and does not have insomnia.      PHYSICAL EXAM:  Blood pressure (!) 145/84, pulse 79, temperature 98.3 F (36.8 C), resp. rate 18, height 5\' 6"  (1.676 m), last menstrual period 10/05/2022.    Vitals reviewed. Constitutional: She is oriented to person, place, and time. She appears well-developed and well-nourished.  HENT:  Head: Normocephalic and atraumatic.  Right Ear: External ear normal.  Left Ear: External ear normal.  Nose: Nose normal.  Mouth/Throat: Oropharynx is clear and moist.  Eyes: Conjunctivae and EOM are normal. Pupils are equal, round, and reactive to light. Right eye exhibits no discharge. Left eye  exhibits no discharge. No scleral icterus.  Neck: Normal range of motion. Neck supple. No tracheal deviation present. No thyromegaly present.  Cardiovascular: Normal rate, regular rhythm, normal heart sounds and intact distal pulses.  Exam reveals no gallop and no friction rub.   No murmur heard. Respiratory: Effort normal and breath sounds normal. No respiratory distress. She has no wheezes. She has no rales. She exhibits no tenderness.  GI: Soft. Bowel sounds are normal. She exhibits no distension and no mass.  There is tenderness. There is no rebound and no guarding.  Genitourinary:       Vulva is normal without lesions Vagina is pink moist without discharge Cervix normal in appearance and pap is normal Uterus is normal size, contour, position, consistency, mobility, non-tender Adnexa is per sonogram Musculoskeletal: Normal range of motion. She exhibits no edema and no tenderness.  Neurological: She is alert and oriented to person, place, and time. She has normal reflexes. She displays normal reflexes. No cranial nerve deficit. She exhibits normal muscle tone. Coordination normal.  Skin: Skin is warm and dry. No rash noted. No erythema. No pallor.  Psychiatric: She has a normal mood and affect. Her behavior is normal. Judgment and thought content normal.    Labs: Results for orders placed or performed during the hospital encounter of 11/08/22 (from the past 336 hour(s))  Pregnancy, urine POC   Collection Time: 11/08/22 12:07 PM  Result Value Ref Range   Preg Test, Ur POSITIVE (A) NEGATIVE  Urinalysis, Routine w reflex microscopic Urine, Clean Catch   Collection Time: 11/08/22 12:11 PM  Result Value Ref Range   Color, Urine YELLOW YELLOW   APPearance HAZY (A) CLEAR   Specific Gravity, Urine 1.018 1.005 - 1.030   pH 5.0 5.0 - 8.0   Glucose, UA NEGATIVE NEGATIVE mg/dL   Hgb urine dipstick LARGE (A) NEGATIVE   Bilirubin Urine NEGATIVE NEGATIVE   Ketones, ur 20 (A) NEGATIVE mg/dL   Protein, ur NEGATIVE NEGATIVE mg/dL   Nitrite NEGATIVE NEGATIVE   Leukocytes,Ua NEGATIVE NEGATIVE   RBC / HPF 0-5 0 - 5 RBC/hpf   WBC, UA 0-5 0 - 5 WBC/hpf   Bacteria, UA RARE (A) NONE SEEN   Squamous Epithelial / LPF 0-5 0 - 5   Mucus PRESENT   hCG, quantitative, pregnancy   Collection Time: 11/08/22  1:05 PM  Result Value Ref Range   hCG, Beta Chain, Quant, S 985 (H) <5 mIU/mL  CBC with Differential/Platelet   Collection Time: 11/08/22  1:13 PM  Result Value Ref Range   WBC 11.7 (H) 4.0 - 10.5 K/uL    RBC 2.94 (L) 3.87 - 5.11 MIL/uL   Hemoglobin 10.6 (L) 12.0 - 15.0 g/dL   HCT 32.2 (L) 02.5 - 42.7 %   MCV 103.1 (H) 80.0 - 100.0 fL   MCH 36.1 (H) 26.0 - 34.0 pg   MCHC 35.0 30.0 - 36.0 g/dL   RDW 06.2 37.6 - 28.3 %   Platelets 262 150 - 400 K/uL   nRBC 0.0 0.0 - 0.2 %   Neutrophils Relative % 83 %   Neutro Abs 9.5 (H) 1.7 - 7.7 K/uL   Lymphocytes Relative 13 %   Lymphs Abs 1.5 0.7 - 4.0 K/uL   Monocytes Relative 4 %   Monocytes Absolute 0.5 0.1 - 1.0 K/uL   Eosinophils Relative 0 %   Eosinophils Absolute 0.0 0.0 - 0.5 K/uL   Basophils Relative 0 %   Basophils Absolute 0.0 0.0 - 0.1  K/uL   Immature Granulocytes 0 %   Abs Immature Granulocytes 0.04 0.00 - 0.07 K/uL  Type and screen   Collection Time: 11/08/22  2:55 PM  Result Value Ref Range   ABO/RH(D) PENDING    Antibody Screen PENDING    Sample Expiration      11/11/2022,2359 Performed at [facility], [address], [place], [place] [address]     EKG: Orders placed or performed during the hospital encounter of 12/18/20   EKG 12-Lead   EKG 12-Lead    Imaging Studies: US OB Comp Less 14 Wks  Result Date: 11/08/2022 CLINICAL DATA:  Abdominal pain EXAM: OBSTETRIC <14 WK [place] AND TRANSVAGINAL OB US TECHNIQUE: Both transabdominal and transvaginal ultrasound examinations were performed for complete evaluation of the gestation as well as the maternal uterus, adnexal regions, and pelvic cul-de-sac. Transvaginal technique was performed to assess early pregnancy. COMPARISON:  None Available. FINDINGS: Intrauterine gestational sac: None Yolk sac:  Not Visualized. Embryo:  Not Visualized. Cardiac Activity: Not Visualized. Heterogeneous material is seen within the endometrium measuring up to 23 mm in thickness with no gestational sac or embryo visualized. Subchorionic hemorrhage:  None visualized. Maternal uterus/adnexae: Heterogeneous masslike soft tissue is seen within the right adnexa measuring approximately 6.6 x 4.3 cm with  probable associated yolk sac. Right adnexal findings are separate from the right ovary. Normal appearance of the left ovary. Moderate to large volume pelvic free fluid. IMPRESSION: 1. Heterogeneous masslike soft tissue seen within the right adnexa with probable associated yolk sac, findings are highly concerning for ruptured ectopic pregnancy. 2. Heterogeneous material is seen within the endometrium, likely due to blood products. 3. Moderate-to-large volume pelvic free fluid. Critical Value/emergent results were called by telephone at the time of interpretation on 11/08/2022 at 2:45 pm to provider [facility] , who verbally acknowledged these results. Electronically Signed   By: Allegra Lai M.D.   On: 11/08/2022 14:45   US OB Transvaginal  Result Date: 11/08/2022 CLINICAL DATA:  Abdominal pain EXAM: OBSTETRIC <14 WK [place] AND TRANSVAGINAL OB US TECHNIQUE: Both transabdominal and transvaginal ultrasound examinations were performed for complete evaluation of the gestation as well as the maternal uterus, adnexal regions, and pelvic cul-de-sac. Transvaginal technique was performed to assess early pregnancy. COMPARISON:  None Available. FINDINGS: Intrauterine gestational sac: None Yolk sac:  Not Visualized. Embryo:  Not Visualized. Cardiac Activity: Not Visualized. Heterogeneous material is seen within the endometrium measuring up to 23 mm in thickness with no gestational sac or embryo visualized. Subchorionic hemorrhage:  None visualized. Maternal uterus/adnexae: Heterogeneous masslike soft tissue is seen within the right adnexa measuring approximately 6.6 x 4.3 cm with probable associated yolk sac. Right adnexal findings are separate from the right ovary. Normal appearance of the left ovary. Moderate to large volume pelvic free fluid. IMPRESSION: 1. Heterogeneous masslike soft tissue seen within the right adnexa with probable associated yolk sac, findings are highly concerning for ruptured ectopic  pregnancy. 2. Heterogeneous material is seen within the endometrium, likely due to blood products. 3. Moderate-to-large volume pelvic free fluid. Critical Value/emergent results were called by telephone at the time of interpretation on 11/08/2022 at 2:45 pm to provider [facility] , who verbally acknowledged these results. Electronically Signed   By: Allegra Lai M.D.   On: 11/08/2022 14:45      Assessment: Right ectopic pregnancy ruptured   Plan: Laparoscopic management of ruptured right ectopic pregnancy  Lazaro Arms 11/08/2022 3:12 PM

## 2022-11-08 NOTE — Op Note (Signed)
Preoperative diagnosis: right ectopic pregnancy, ruptured  Postoperative diagnosis: Ruptured right ectopic pregnancy with 200 cc hemoperitoneum   Procedure: Laparoscopic right salpino oophorectomy   Surgeon: Lazaro Arms   Anesthesia: Gen. Endotracheal   Findings:   Intraoperatively the patient had about 200 cc of mostly clotted blood there was a small trickle coming from the ectopic but nothing significant. The entire middle half of the tube was ruptured and was not salvageable. The ectopic was completely surrounding the right ovary complex as well.  The left fallopian tube appeared to be normal without any evidence of hydrosalpinx adhesions and the fimbria were normal.   Description of operation: Patient was taken to the operating room and placed in the supine position where she underwent general endotracheal anesthesia. She was placed in dorsal lithotomy position. She was prepped and draped in usual sterile fashion. Incision was made above the umbilicus and a varies needle was placed into the peritoneal cavity with one pass that difficulty. The peritoneal cavity was insufflated. A 1011 non-bladed trocar was placed using a video laparoscope under direct visualization without difficulty. An incision was made in the right and left lower quadrants and 5 mm non-bladed trochars were placed in each site without difficulty under direct visualization. There was a significant amount of blood in the belly the time of surgery and I estimated at 200 cc judging by what I was able to suction out.    The fallopian tube had been ruptured. Harmonic scalpel was used and a salpingectomy, part of the ovary was removed as well as it engulfed. There was good hemostasis. The pelvis was irrigated and hemostasis once again confirmed. The left fallopian tube was completely normal and there were no other intraperitoneal abnormalities appreciated.   The trochars were removed and the gas was allowed to escape from the  abdomen. The 2-5 mm trocar sites were closed with 3-0 vicryl in a subcuticular fashion, covered with dermabond and injected with a total of 5cc of exparel each. The umbilical fascia was closed with single 0 Vicryl suture and the subcutaneous tissue was also closed using Vicryl. The skin was closed using 3-0 vicryl. 10 cc of expael was injected here as well.   The patient remained hemodynamically stable throughout the entire procedure was awakened from anesthesia and taken to the recovery room in good stable condition with all counts being correct.   She received 2 g of Ancef and 30 mg of Toradol prophylactically. There was no real intraoperative blood loss only the hemoperitoneum which was appreciated and present upon peritoneal entry.   Lazaro Arms, MD 11/08/2022 5:31 PM

## 2022-11-08 NOTE — MAU Provider Note (Signed)
MAU Provider Note  History  [id number]  Arrival date and time: 11/08/22 1145   Chief Complaint  Patient presents with   Vaginal Bleeding   Miscarriage     HPI Megan Nolan is a 36 y.o. G3P1011 at [redacted]w[redacted]d by LMP with PMHx notable for recent diagnosis of miscarriage in [place], who presents for follow-up on miscarriage.  Patient has not passed any clots or tissue since her miscarriage diagnosis on 12/18.  On 12/22 she was given "medication" to help pass the remainder of her pregnancy.  She was kept overnight in the hospital in [place] but no tissue or clots passed.  She flew home and has been having abdominal pain since.  She has been spotting brown liquid per vagina.  She rates her pain a 9 out of 10. Admits vomitting, cannot keep any food down. Tolerated 3 bites of food this morning at 9am.    Vaginal bleeding: Yes LOF: No Fetal Movement: No Contractions: No  --/--/PENDING (12/25 1455)  OB History     Gravida  3   Para  1   Term  1   Preterm      AB  1   Living  1      SAB  0   IAB  1   Ectopic      Multiple      Live Births  1           Past Medical History:  Diagnosis Date   Abnormal Pap smear    Bacterial vaginosis    Medical history non-contributory    No pertinent past medical history     Past Surgical History:  Procedure Laterality Date   CERVIX LESION DESTRUCTION     WISDOM TOOTH EXTRACTION      Family History  Problem Relation Age of Onset   Cancer Maternal Grandmother     Social History   Socioeconomic History   Marital status: Single    Spouse name: Not on file   Number of children: Not on file   Years of education: Not on file   Highest education level: Not on file  Occupational History   Not on file  Tobacco Use   Smoking status: Former    Types: Cigarettes    Quit date: 04/01/2012    Years since quitting: 10.6   Smokeless tobacco: Never  Vaping Use   Vaping Use: Not on file  Substance and Sexual Activity    Alcohol use: Not Currently    Comment: occasionally   Drug use: Not Currently    Types: Marijuana   Sexual activity: Yes    Comment: Pt states recieved Depo around the beginning of May  Other Topics Concern   Not on file  Social History Narrative   Not on file   Social Determinants of Health   Financial Resource Strain: Not on file  Food Insecurity: Not on file  Transportation Needs: Not on file  Physical Activity: Not on file  Stress: Not on file  Social Connections: Not on file  Intimate Partner Violence: Not on file    No Known Allergies  No current facility-administered medications on file prior to encounter.   Current Outpatient Medications on File Prior to Encounter  Medication Sig Dispense Refill   acetaminophen (TYLENOL) 650 MG CR tablet Take 1,000 mg by mouth every 8 (eight) hours as needed for pain.       Review of Systems  Constitutional:  Negative for appetite change, chills and fever.  HENT:  Negative for congestion, facial swelling and postnasal drip.   Eyes:  Negative for photophobia.  Respiratory:  Negative for cough and shortness of breath.   Cardiovascular:  Negative for chest pain.  Gastrointestinal:  Positive for abdominal pain, nausea and vomiting.  Endocrine: Negative for polyuria.  Genitourinary:  Negative for flank pain and pelvic pain.  Musculoskeletal:  Negative for arthralgias.  Skin:  Negative for rash.  Neurological:  Negative for weakness and light-headedness.  Psychiatric/Behavioral:  Negative for confusion.     Pertinent positives and negative per HPI, all others reviewed and negative  Physical Exam   BP (!) 145/84   Pulse 79   Temp 98.3 F (36.8 C)   Resp 18   Ht 5\' 6"  (1.676 m)   LMP 10/05/2022   BMI 25.82 kg/m   Patient Vitals for the past 24 hrs:  BP Temp Pulse Resp Height  11/08/22 1218 (!) 145/84 98.3 F (36.8 C) 79 18 5\' 6"  (1.676 m)    Physical Exam Vitals reviewed.  Constitutional:      Appearance: Normal  appearance.  HENT:     Head: Normocephalic and atraumatic.     Right Ear: External ear normal.     Left Ear: External ear normal.  Cardiovascular:     Rate and Rhythm: Normal rate and regular rhythm.  Pulmonary:     Effort: Pulmonary effort is normal.     Breath sounds: Normal breath sounds.  Abdominal:     General: Abdomen is flat. There is no distension.     Palpations: Abdomen is soft.     Tenderness: There is abdominal tenderness. There is no guarding.  Skin:    General: Skin is warm.     Capillary Refill: Capillary refill takes less than 2 seconds.  Psychiatric:        Mood and Affect: Mood normal.    Cervical Exam   Not done  Bedside Ultrasound Done Pt informed that the ultrasound is considered a limited OB ultrasound and is not intended to be a complete ultrasound exam.  Patient also informed that the ultrasound is not being completed with the intent of assessing for fetal or placental anomalies or any pelvic abnormalities.  Explained that the purpose of today's ultrasound is to assess for   products of conception in uterus or adnexa and viability.  Patient acknowledges the purpose of the exam and the limitations of the study.    FHT Miscarriage  Labs Results for orders placed or performed during the hospital encounter of 11/08/22 (from the past 24 hour(s))  Pregnancy, urine POC     Status: Abnormal   Collection Time: 11/08/22 12:07 PM  Result Value Ref Range   Preg Test, Ur POSITIVE (A) NEGATIVE  Urinalysis, Routine w reflex microscopic Urine, Clean Catch     Status: Abnormal   Collection Time: 11/08/22 12:11 PM  Result Value Ref Range   Color, Urine YELLOW YELLOW   APPearance HAZY (A) CLEAR   Specific Gravity, Urine 1.018 1.005 - 1.030   pH 5.0 5.0 - 8.0   Glucose, UA NEGATIVE NEGATIVE mg/dL   Hgb urine dipstick LARGE (A) NEGATIVE   Bilirubin Urine NEGATIVE NEGATIVE   Ketones, ur 20 (A) NEGATIVE mg/dL   Protein, ur NEGATIVE NEGATIVE mg/dL   Nitrite NEGATIVE  NEGATIVE   Leukocytes,Ua NEGATIVE NEGATIVE   RBC / HPF 0-5 0 - 5 RBC/hpf   WBC, UA 0-5 0 - 5 WBC/hpf   Bacteria, UA RARE (A) NONE SEEN  Squamous Epithelial / LPF 0-5 0 - 5   Mucus PRESENT   hCG, quantitative, pregnancy     Status: Abnormal   Collection Time: 11/08/22  1:05 PM  Result Value Ref Range   hCG, Beta Chain, Quant, S 985 (H) <5 mIU/mL  CBC with Differential/Platelet     Status: Abnormal   Collection Time: 11/08/22  1:13 PM  Result Value Ref Range   WBC 11.7 (H) 4.0 - 10.5 K/uL   RBC 2.94 (L) 3.87 - 5.11 MIL/uL   Hemoglobin 10.6 (L) 12.0 - 15.0 g/dL   HCT 30.3 (L) 36.0 - 46.0 %   MCV 103.1 (H) 80.0 - 100.0 fL   MCH 36.1 (H) 26.0 - 34.0 pg   MCHC 35.0 30.0 - 36.0 g/dL   RDW 11.6 11.5 - 15.5 %   Platelets 262 150 - 400 K/uL   nRBC 0.0 0.0 - 0.2 %   Neutrophils Relative % 83 %   Neutro Abs 9.5 (H) 1.7 - 7.7 K/uL   Lymphocytes Relative 13 %   Lymphs Abs 1.5 0.7 - 4.0 K/uL   Monocytes Relative 4 %   Monocytes Absolute 0.5 0.1 - 1.0 K/uL   Eosinophils Relative 0 %   Eosinophils Absolute 0.0 0.0 - 0.5 K/uL   Basophils Relative 0 %   Basophils Absolute 0.0 0.0 - 0.1 K/uL   Immature Granulocytes 0 %   Abs Immature Granulocytes 0.04 0.00 - 0.07 K/uL  Type and screen     Status: None (Preliminary result)   Collection Time: 11/08/22  2:55 PM  Result Value Ref Range   ABO/RH(D) PENDING    Antibody Screen PENDING    Sample Expiration      11/11/2022,2359 Performed at [facility], [address] [place], [place] [address]     Imaging [place] Comp Less 14 Wks  Result Date: 11/08/2022 CLINICAL DATA:  Abdominal pain EXAM: OBSTETRIC <14 WK [place] AND TRANSVAGINAL OB US TECHNIQUE: Both transabdominal and transvaginal ultrasound examinations were performed for complete evaluation of the gestation as well as the maternal uterus, adnexal regions, and pelvic cul-de-sac. Transvaginal technique was performed to assess early pregnancy. COMPARISON:  None Available. FINDINGS:  Intrauterine gestational sac: None Yolk sac:  Not Visualized. Embryo:  Not Visualized. Cardiac Activity: Not Visualized. Heterogeneous material is seen within the endometrium measuring up to 23 mm in thickness with no gestational sac or embryo visualized. Subchorionic hemorrhage:  None visualized. Maternal uterus/adnexae: Heterogeneous masslike soft tissue is seen within the right adnexa measuring approximately 6.6 x 4.3 cm with probable associated yolk sac. Right adnexal findings are separate from the right ovary. Normal appearance of the left ovary. Moderate to large volume pelvic free fluid. IMPRESSION: 1. Heterogeneous masslike soft tissue seen within the right adnexa with probable associated yolk sac, findings are highly concerning for ruptured ectopic pregnancy. 2. Heterogeneous material is seen within the endometrium, likely due to blood products. 3. Moderate-to-large volume pelvic free fluid. Critical Value/emergent results were called by telephone at the time of interpretation on 11/08/2022 at 2:45 pm to provider [facility] , who verbally acknowledged these results. Electronically Signed   By: Yetta Glassman M.D.   On: 11/08/2022 14:45   US OB Transvaginal  Result Date: 11/08/2022 CLINICAL DATA:  Abdominal pain EXAM: OBSTETRIC <14 WK [place] AND TRANSVAGINAL OB US TECHNIQUE: Both transabdominal and transvaginal ultrasound examinations were performed for complete evaluation of the gestation as well as the maternal uterus, adnexal regions, and pelvic cul-de-sac. Transvaginal technique  was performed to assess early pregnancy. COMPARISON:  None Available. FINDINGS: Intrauterine gestational sac: None Yolk sac:  Not Visualized. Embryo:  Not Visualized. Cardiac Activity: Not Visualized. Heterogeneous material is seen within the endometrium measuring up to 23 mm in thickness with no gestational sac or embryo visualized. Subchorionic hemorrhage:  None visualized. Maternal uterus/adnexae: Heterogeneous  masslike soft tissue is seen within the right adnexa measuring approximately 6.6 x 4.3 cm with probable associated yolk sac. Right adnexal findings are separate from the right ovary. Normal appearance of the left ovary. Moderate to large volume pelvic free fluid. IMPRESSION: 1. Heterogeneous masslike soft tissue seen within the right adnexa with probable associated yolk sac, findings are highly concerning for ruptured ectopic pregnancy. 2. Heterogeneous material is seen within the endometrium, likely due to blood products. 3. Moderate-to-large volume pelvic free fluid. Critical Value/emergent results were called by telephone at the time of interpretation on 11/08/2022 at 2:45 pm to provider [facility] , who verbally acknowledged these results. Electronically Signed   By: Allegra Lai M.D.   On: 11/08/2022 14:45    MAU Course  Procedures Lab Orders         Urinalysis, Routine w reflex microscopic Urine, Clean Catch         hCG, quantitative, pregnancy         CBC with Differential/Platelet         Pregnancy, urine POC    Meds ordered this encounter  Medications   lactated ringers bolus 1,000 mL   HYDROmorphone (DILAUDID) injection 1 mg   ondansetron (ZOFRAN) 8 mg in sodium chloride 0.9 % 50 mL IVPB   Imaging Orders         US OB Comp Less 14 Wks         US OB Transvaginal      MDM moderate  Assessment and Plan  Ruptured ectopic Right pregnancy 4 weeks   Patient here for follow-up miscarriage diagnosed in [place].  She was apparently given methotrexate but had not been passing any clots or tissue, just dark brown spotting.  Significant nausea and abdominal pain noted.  Bedside ultrasound showed products in uterus and adnexal fluid.  Formal ultrasound showedHeterogeneous masslike soft tissue seen within the right adnexa with probable associated yolk sac, findings are highly concerning for ruptured ectopic pregnancy.  Medication given for nausea, IV fluid bolus given and  Dilaudid given for pain. Dr. Despina Hidden called for gyn surgery consult. Pt will proceed with laparoscopy for R ruptured ectopic pregnancy.   Dispo: TO OR    Myrtie Hawk, DO FMOB Fellow, Faculty practice [facility], [facility] for [facility] 11/08/22  3:19 PM

## 2022-11-08 NOTE — Anesthesia Postprocedure Evaluation (Signed)
Anesthesia Post Note  Patient: Megan Nolan  Procedure(s) Performed: DIAGNOSTIC LAPAROSCOPY WITH REMOVAL OF ECTOPIC PREGNANCY (Abdomen)     Patient location during evaluation: PACU Anesthesia Type: General Level of consciousness: sedated, patient cooperative and oriented Pain management: pain level controlled Vital Signs Assessment: post-procedure vital signs reviewed and stable Respiratory status: spontaneous breathing, nonlabored ventilation and respiratory function stable Cardiovascular status: blood pressure returned to baseline and stable Postop Assessment: no apparent nausea or vomiting Anesthetic complications: no   No notable events documented.  Last Vitals:  Vitals:   11/08/22 1740 11/08/22 1745  BP: 121/67 121/68  Pulse: 82 80  Resp: 12 10  Temp: (!) 36.2 C   SpO2: 95% 96%    Last Pain:  Vitals:   11/08/22 1740  PainSc: 0-No pain                 Toula Miyasaki,E. Marris Frontera

## 2022-11-08 NOTE — Anesthesia Procedure Notes (Signed)
Procedure Name: Intubation Date/Time: 11/08/2022 4:20 PM  Performed by: Eligha Bridegroom, CRNAPre-anesthesia Checklist: Patient identified, Emergency Drugs available, Suction available, Patient being monitored and Timeout performed Patient Re-evaluated:Patient Re-evaluated prior to induction Oxygen Delivery Method: Circle system utilized Induction Type: Rapid sequence and Cricoid Pressure applied Laryngoscope Size: Mac and 3 Grade View: Grade I Tube type: Oral Tube size: 7.0 mm Number of attempts: 1 Airway Equipment and Method: Stylet Placement Confirmation: ETT inserted through vocal cords under direct vision, positive ETCO2, CO2 detector and breath sounds checked- equal and bilateral Secured at: 21 cm Tube secured with: Tape Dental Injury: Teeth and Oropharynx as per pre-operative assessment

## 2022-11-08 NOTE — Transfer of Care (Signed)
Immediate Anesthesia Transfer of Care Note  Patient: Megan Nolan  Procedure(s) Performed: DIAGNOSTIC LAPAROSCOPY WITH REMOVAL OF ECTOPIC PREGNANCY (Abdomen)  Patient Location: PACU  Anesthesia Type:General  Level of Consciousness: awake and alert   Airway & Oxygen Therapy: Patient Spontanous Breathing  Post-op Assessment: Report given to RN and Post -op Vital signs reviewed and stable  Post vital signs: Reviewed and stable  Last Vitals:  Vitals Value Taken Time  BP 121/68 11/08/22 1745  Temp 36.2 C 11/08/22 1740  Pulse 81 11/08/22 1746  Resp 10 11/08/22 1746  SpO2 96 % 11/08/22 1746  Vitals shown include unvalidated device data.  Last Pain:  Vitals:   11/08/22 1740  PainSc: 0-No pain         Complications: No notable events documented.

## 2022-11-08 NOTE — MAU Note (Signed)
.  Megan Nolan is a 36 y.o. at Unknown here in MAU reporting: pt was in [place] on vacation. On 12/18//23Went to hospital and was told she was having a miscarriage. On 11/05/22 was givne pills to help pass the remader of pregnancy. They kept her overnight but did not pass anything. Flew home on 12/230 and has been having abd pain ever since . Pain is more on the right side and making it difficult to walk. Has not passed anything through vagina . Just having brown spotting. Not passed any vclots or bleeding enough to change a pad. spotting  LMP: 10/05/22 Onset of complaint: 11/01/22 Pain score: 9 Vitals:   11/08/22 1218  BP: (!) 145/84  Pulse: 79  Resp: 18  Temp: 98.3 F (36.8 C)     FHT:n/a Lab orders placed from triage:  u/a, UPT

## 2022-11-08 NOTE — Anesthesia Preprocedure Evaluation (Addendum)
Anesthesia Evaluation  Patient identified by MRN, date of birth, ID band Patient awake    Reviewed: Allergy & Precautions, NPO status , Patient's Chart, lab work & pertinent test results  History of Anesthesia Complications Negative for: history of anesthetic complications  Airway Mallampati: II  TM Distance: >3 FB Neck ROM: Full    Dental  (+) Caps, Dental Advisory Given   Pulmonary Current Smoker and Patient abstained from smoking.   breath sounds clear to auscultation       Cardiovascular negative cardio ROS  Rhythm:Regular Rate:Normal     Neuro/Psych negative neurological ROS     GI/Hepatic ,,,(+)     substance abuse  marijuana useN/v with acute ectopic pregnancy   Endo/Other  negative endocrine ROS    Renal/GU negative Renal ROS     Musculoskeletal   Abdominal   Peds  Hematology  (+) Blood dyscrasia (Hb 10.6), anemia   Anesthesia Other Findings   Reproductive/Obstetrics (+) Pregnancy (ectopic)                             Anesthesia Physical Anesthesia Plan  ASA: 2 and emergent  Anesthesia Plan: General   Post-op Pain Management: Ofirmev IV (intra-op)*   Induction: Intravenous and Rapid sequence  PONV Risk Score and Plan: 3 and Ondansetron, Dexamethasone, Treatment may vary due to age or medical condition and Scopolamine patch - Pre-op  Airway Management Planned: Oral ETT  Additional Equipment: None  Intra-op Plan:   Post-operative Plan: Extubation in OR  Informed Consent: I have reviewed the patients History and Physical, chart, labs and discussed the procedure including the risks, benefits and alternatives for the proposed anesthesia with the patient or authorized representative who has indicated his/her understanding and acceptance.     Dental advisory given  Plan Discussed with: CRNA and Surgeon  Anesthesia Plan Comments:        Anesthesia Quick  Evaluation

## 2022-11-09 ENCOUNTER — Encounter: Payer: Self-pay | Admitting: Family Medicine

## 2022-11-09 ENCOUNTER — Encounter (HOSPITAL_COMMUNITY): Payer: Self-pay | Admitting: Obstetrics & Gynecology

## 2022-11-10 LAB — SURGICAL PATHOLOGY

## 2023-02-03 ENCOUNTER — Encounter: Payer: Medicaid Other | Admitting: Obstetrics and Gynecology

## 2023-03-17 ENCOUNTER — Ambulatory Visit (INDEPENDENT_AMBULATORY_CARE_PROVIDER_SITE_OTHER): Payer: Medicaid Other | Admitting: Obstetrics and Gynecology

## 2023-03-17 ENCOUNTER — Other Ambulatory Visit (HOSPITAL_COMMUNITY)
Admission: RE | Admit: 2023-03-17 | Discharge: 2023-03-17 | Disposition: A | Discharge status: Home / Self Care | Payer: Medicaid Other | Source: Ambulatory Visit | Attending: Obstetrics and Gynecology | Admitting: Obstetrics and Gynecology

## 2023-03-17 ENCOUNTER — Encounter: Payer: Self-pay | Admitting: Obstetrics and Gynecology

## 2023-03-17 VITALS — BP 121/79 | HR 73 | Ht 66.5 in | Wt 159.0 lb

## 2023-03-17 DIAGNOSIS — Z01419 Encounter for gynecological examination (general) (routine) without abnormal findings: Secondary | ICD-10-CM | POA: Insufficient documentation

## 2023-03-17 DIAGNOSIS — Z9079 Acquired absence of other genital organ(s): Secondary | ICD-10-CM | POA: Insufficient documentation

## 2023-03-17 DIAGNOSIS — Z202 Contact with and (suspected) exposure to infections with a predominantly sexual mode of transmission: Secondary | ICD-10-CM | POA: Insufficient documentation

## 2023-03-17 NOTE — Progress Notes (Signed)
Megan Nolan is a 37 y.o. [phone] female here for a routine annual gynecologic exam.  Current complaints: STD testing.   Denies abnormal vaginal bleeding, discharge, pelvic pain, problems with intercourse or other gynecologic concerns.    Gynecologic History Patient's last menstrual period was 03/12/2023 (exact date). Contraception: none Last Pap: uncertain. Results were: normal Last mammogram: NA.   Obstetric History OB History  Gravida Para Term Preterm AB Living  3 1 1   2 1   SAB IAB Ectopic Multiple Live Births  0 1 1   1     # Outcome Date GA Lbr Len/2nd Weight Sex Delivery Anes PTL Lv  3 Ectopic 10/2022          2 Term 06/03/07 [redacted]w[redacted]d  6 lb 15 oz (3.147 kg) F Vag-Spont EPI  LIV     Birth Comments: No complication  1 IAB             Past Medical History:  Diagnosis Date   Abnormal Pap smear    Bacterial vaginosis    Medical history non-contributory    No pertinent past medical history     Past Surgical History:  Procedure Laterality Date   CERVIX LESION DESTRUCTION     DIAGNOSTIC LAPAROSCOPY WITH REMOVAL OF ECTOPIC PREGNANCY N/A 11/08/2022   Procedure: DIAGNOSTIC LAPAROSCOPY WITH REMOVAL OF ECTOPIC PREGNANCY;  Surgeon: Lazaro Arms, MD;  Location: [facility];  Service: Gynecology;  Laterality: N/A;   WISDOM TOOTH EXTRACTION      No current outpatient medications on file prior to visit.   No current facility-administered medications on file prior to visit.    No Known Allergies  Social History   Socioeconomic History   Marital status: Significant Other    Spouse name: Not on file   Number of children: 1   Years of education: Not on file   Highest education level: Not on file  Occupational History   Not on file  Tobacco Use   Smoking status: Former    Types: Cigarettes    Quit date: 04/01/2012    Years since quitting: 10.9   Smokeless tobacco: Never  Vaping Use   Vaping Use: Never used  Substance and Sexual Activity   Alcohol use: Yes    Comment:  occasionally   Drug use: Not Currently    Types: Marijuana   Sexual activity: Yes    Birth control/protection: None    Comment: Pt states recieved Depo around the beginning of May  Other Topics Concern   Not on file  Social History Narrative   Not on file   Social Determinants of Health   Financial Resource Strain: Not on file  Food Insecurity: Not on file  Transportation Needs: Not on file  Physical Activity: Not on file  Stress: Not on file  Social Connections: Not on file  Intimate Partner Violence: Not on file    Family History  Problem Relation Age of Onset   Cancer Maternal Grandmother    Cancer Paternal Grandmother     The following portions of the patient's history were reviewed and updated as appropriate: allergies, current medications, past family history, past medical history, past social history, past surgical history and problem list.  Review of Systems Pertinent items noted in HPI and remainder of comprehensive ROS otherwise negative.   Objective:  BP 121/79   Pulse 73   Ht 5' 6.5" (1.689 m)   Wt 159 lb (72.1 kg)   LMP 03/12/2023 (Exact Date)   Breastfeeding  No   BMI 25.28 kg/m  Chaperone present CONSTITUTIONAL: Well-developed, well-nourished female in no acute distress.  HENT:  Normocephalic, atraumatic, External right and left ear normal. Oropharynx is clear and moist EYES: Conjunctivae and EOM are normal. Pupils are equal, round, and reactive to light. No scleral icterus.  NECK: Normal range of motion, supple, no masses.  Normal thyroid.  SKIN: Skin is warm and dry. No rash noted. Not diaphoretic. No erythema. No pallor. NEUROLGIC: Alert and oriented to person, place, and time. Normal reflexes, muscle tone coordination. No cranial nerve deficit noted. PSYCHIATRIC: Normal mood and affect. Normal behavior. Normal judgment and thought content. CARDIOVASCULAR: Normal heart rate noted, regular rhythm RESPIRATORY: Clear to auscultation bilaterally. Effort  and breath sounds normal, no problems with respiration noted. BREASTS: Symmetric in size. No masses, skin changes, nipple drainage, or lymphadenopathy. ABDOMEN: Soft, normal bowel sounds, no distention noted.  No tenderness, rebound or guarding.  PELVIC: Normal appearing external genitalia; normal appearing vaginal mucosa and cervix.  No abnormal discharge noted.  Pap smear obtained.  Normal uterine size, no other palpable masses, no uterine or adnexal tenderness. MUSCULOSKELETAL: Normal range of motion. No tenderness.  No cyanosis, clubbing, or edema.  2+ distal pulses.   Assessment:  Annual gynecologic examination with pap smear STD exposure Plan:  Will follow up results of pap smear and manage accordingly. STD testing as per pt request Routine preventative health maintenance measures emphasized. Please refer to After Visit Summary for other counseling recommendations.    Hermina Staggers, MD, FACOG Attending Obstetrician & Gynecologist Center for [facility], [facility]

## 2023-03-17 NOTE — Progress Notes (Signed)
37 y.o. New GYN presents for AEX/PAP/STD screening.  Pt wants to conceive, she is concerned that her ectopic pregnancy will prevent her for getting pregnant again.

## 2023-03-18 LAB — HEPATITIS B SURFACE ANTIGEN: Hepatitis B Surface Ag: NEGATIVE

## 2023-03-18 LAB — HEPATITIS C ANTIBODY: Hep C Virus Ab: NONREACTIVE

## 2023-03-18 LAB — RPR: RPR Ser Ql: NONREACTIVE

## 2023-03-18 LAB — HIV ANTIBODY (ROUTINE TESTING W REFLEX): HIV Screen 4th Generation wRfx: NONREACTIVE

## 2023-03-21 ENCOUNTER — Other Ambulatory Visit: Payer: Self-pay | Admitting: *Deleted

## 2023-03-21 ENCOUNTER — Encounter: Payer: Self-pay | Admitting: *Deleted

## 2023-03-21 DIAGNOSIS — A749 Chlamydial infection, unspecified: Secondary | ICD-10-CM

## 2023-03-21 DIAGNOSIS — B9689 Other specified bacterial agents as the cause of diseases classified elsewhere: Secondary | ICD-10-CM

## 2023-03-21 LAB — CERVICOVAGINAL ANCILLARY ONLY
Bacterial Vaginitis (gardnerella): POSITIVE — AB
Candida Glabrata: NEGATIVE
Candida Vaginitis: NEGATIVE
Chlamydia: POSITIVE — AB
Comment: NEGATIVE
Comment: NEGATIVE
Comment: NEGATIVE
Comment: NEGATIVE
Comment: NEGATIVE
Comment: NORMAL
Neisseria Gonorrhea: NEGATIVE
Trichomonas: NEGATIVE

## 2023-03-21 MED ORDER — DOXYCYCLINE HYCLATE 100 MG PO CAPS: 100.0000 mg | ORAL_CAPSULE | Freq: Two times a day (BID) | ORAL | 0 refills | Status: DC

## 2023-03-21 MED ORDER — METRONIDAZOLE 500 MG PO TABS: 500.0000 mg | ORAL_TABLET | Freq: Two times a day (BID) | ORAL | 0 refills | Status: DC

## 2023-03-21 NOTE — Progress Notes (Signed)
Flagyl and Doxycycline sent pre protocol. See lab result. Health dept form completed and faxed.

## 2023-03-22 LAB — CYTOLOGY - PAP
Comment: NEGATIVE
Diagnosis: NEGATIVE
High risk HPV: NEGATIVE

## 2023-05-09 ENCOUNTER — Encounter (HOSPITAL_COMMUNITY): Payer: Self-pay

## 2023-05-09 ENCOUNTER — Ambulatory Visit (HOSPITAL_COMMUNITY)
Admission: EM | Admit: 2023-05-09 | Discharge: 2023-05-09 | Disposition: A | Discharge status: Home / Self Care | Payer: Medicaid Other | Attending: Emergency Medicine | Admitting: Emergency Medicine

## 2023-05-09 DIAGNOSIS — Z202 Contact with and (suspected) exposure to infections with a predominantly sexual mode of transmission: Secondary | ICD-10-CM | POA: Insufficient documentation

## 2023-05-09 DIAGNOSIS — Z113 Encounter for screening for infections with a predominantly sexual mode of transmission: Secondary | ICD-10-CM | POA: Insufficient documentation

## 2023-05-09 MED ORDER — AZITHROMYCIN 250 MG PO TABS
1000.0000 mg | ORAL_TABLET | Freq: Once | ORAL | Status: AC
  Administered 2023-05-09: 1000 mg via ORAL

## 2023-05-09 MED ORDER — AZITHROMYCIN 250 MG PO TABS
ORAL_TABLET | ORAL | Status: AC
  Filled 2023-05-09: qty 4

## 2023-05-09 NOTE — ED Triage Notes (Addendum)
Patient states that her partner just informed her that he tested positive for Chlamydia.   She would like to be tested for STDs.

## 2023-05-09 NOTE — ED Provider Notes (Signed)
[facility]    CSN: [id number] Arrival date & time: 05/09/23  0806      History   Chief Complaint Chief Complaint  Patient presents with   Exposure to STD    HPI Megan Nolan is a 37 y.o. female.  Partner informed her this morning he is positive for chlamydia. Unprotected intercourse since this diagnosis.  Requesting STD testing and treatment No abd pain, NVD, rash, lesions, discharge, itching, dysuria.  LMP 6/11  BV in may History tubal pregnancy   Past Medical History:  Diagnosis Date   Abnormal Pap smear    Bacterial vaginosis    Medical history non-contributory    No pertinent past medical history     Patient Active Problem List   Diagnosis Date Noted   Visit for routine gyn exam 03/17/2023   History of salpingectomy 03/17/2023   STD exposure 03/17/2023    Past Surgical History:  Procedure Laterality Date   CERVIX LESION DESTRUCTION     DIAGNOSTIC LAPAROSCOPY WITH REMOVAL OF ECTOPIC PREGNANCY N/A 11/08/2022   Procedure: DIAGNOSTIC LAPAROSCOPY WITH REMOVAL OF ECTOPIC PREGNANCY;  Surgeon: Lazaro Arms, MD;  Location: [facility];  Service: Gynecology;  Laterality: N/A;   WISDOM TOOTH EXTRACTION      OB History     Gravida  3   Para  1   Term  1   Preterm      AB  2   Living  1      SAB  0   IAB  1   Ectopic  1   Multiple      Live Births  1            Home Medications    Prior to Admission medications   Not on File    Family History Family History  Problem Relation Age of Onset   Cancer Maternal Grandmother    Cancer Paternal Grandmother     Social History Social History   Tobacco Use   Smoking status: Former    Types: Cigarettes    Quit date: 04/01/2012    Years since quitting: 11.1   Smokeless tobacco: Never  Vaping Use   Vaping Use: Never used  Substance Use Topics   Alcohol use: Yes    Comment: occasionally   Drug use: Not Currently    Types: Marijuana     Allergies   Patient has no  known allergies.   Review of Systems Review of Systems As per HPI  Physical Exam Triage Vital Signs ED Triage Vitals  Enc Vitals Group     BP 05/09/23 0836 112/71     Pulse Rate 05/09/23 0836 76     Resp 05/09/23 0836 16     Temp 05/09/23 0836 98.3 F (36.8 C)     Temp Source 05/09/23 0836 Oral     SpO2 05/09/23 0836 96 %     Weight 05/09/23 0857 155 lb (70.3 kg)     Height 05/09/23 0857 5\' 6"  (1.676 m)     Head Circumference --      Peak Flow --      Pain Score 05/09/23 0835 0     Pain Loc --      Pain Edu? --      Excl. in GC? --    No data found.  Updated Vital Signs BP 112/71 (BP Location: Right Arm)   Pulse 76   Temp 98.3 F (36.8 C) (Oral)   Resp 16  Ht 5\' 6"  (1.676 m)   Wt 155 lb (70.3 kg)   LMP 04/26/2023 (Exact Date)   SpO2 96%   BMI 25.02 kg/m    Physical Exam Vitals and nursing note reviewed.  Constitutional:      General: She is not in acute distress.    Appearance: Normal appearance.  HENT:     Mouth/Throat:     Pharynx: Oropharynx is clear.  Cardiovascular:     Rate and Rhythm: Normal rate and regular rhythm.     Heart sounds: Normal heart sounds.  Pulmonary:     Effort: Pulmonary effort is normal.     Breath sounds: Normal breath sounds.  Abdominal:     Palpations: Abdomen is soft.     Tenderness: There is no abdominal tenderness.  Neurological:     Mental Status: She is alert and oriented to person, place, and time.      UC Treatments / Results  Labs (all labs ordered are listed, but only abnormal results are displayed) Labs Reviewed  CERVICOVAGINAL ANCILLARY ONLY    EKG  Radiology No results found.  Procedures Procedures (including critical care time)  Medications Ordered in UC Medications  azithromycin (ZITHROMAX) tablet 1,000 mg (1,000 mg Oral Given 05/09/23 0908)    Initial Impression / Assessment and Plan / UC Course  I have reviewed the triage vital signs and the nursing notes.  Pertinent labs & imaging  results that were available during my care of the patient were reviewed by me and considered in my medical decision making (see chart for details).  Positive exposure to chlamydia. Swab is pending but patient would like treatment. Reports she does not do well with antibiotics by mouth. Discussed doxycyline is the preferred treatment, but patient would not want to take the full course. Will treat with 1g azithromycin in clinic. Will call with other positive result if needed. No questions at this time  Final Clinical Impressions(s) / UC Diagnoses   Final diagnoses:  Exposure to chlamydia  Screen for STD (sexually transmitted disease)     Discharge Instructions      We have treated you for chlamydia today. We will call you if anything else on your swab returns positive. You can also see these results on MyChart. Please abstain from sexual intercourse until your results return.     ED Prescriptions   None    PDMP not reviewed this encounter.   Julianna Vanwagner, Lurena Joiner, [place] 05/09/23 [phone]

## 2023-05-09 NOTE — Discharge Instructions (Signed)
We have treated you for chlamydia today. We will call you if anything else on your swab returns positive. You can also see these results on MyChart. Please abstain from sexual intercourse until your results return.

## 2023-05-13 ENCOUNTER — Telehealth (HOSPITAL_COMMUNITY): Payer: Self-pay | Admitting: Emergency Medicine

## 2023-05-13 LAB — CERVICOVAGINAL ANCILLARY ONLY
Bacterial Vaginitis (gardnerella): NEGATIVE
Candida Glabrata: NEGATIVE
Candida Vaginitis: NEGATIVE
Chlamydia: POSITIVE — AB
Comment: NEGATIVE
Comment: NEGATIVE
Comment: NEGATIVE
Comment: NEGATIVE
Comment: NEGATIVE
Comment: NORMAL
Neisseria Gonorrhea: NEGATIVE
Trichomonas: POSITIVE — AB

## 2023-05-13 MED ORDER — METRONIDAZOLE 500 MG PO TABS: 500.0000 mg | ORAL_TABLET | Freq: Two times a day (BID) | ORAL | 0 refills | Status: DC

## 2023-08-11 ENCOUNTER — Ambulatory Visit (HOSPITAL_COMMUNITY)
Admission: EM | Admit: 2023-08-11 | Discharge: 2023-08-11 | Disposition: A | Discharge status: Home / Self Care | Payer: Medicaid Other | Attending: Family Medicine | Admitting: Family Medicine

## 2023-08-11 ENCOUNTER — Encounter (HOSPITAL_COMMUNITY): Payer: Self-pay

## 2023-08-11 DIAGNOSIS — N760 Acute vaginitis: Secondary | ICD-10-CM | POA: Insufficient documentation

## 2023-08-11 DIAGNOSIS — Z711 Person with feared health complaint in whom no diagnosis is made: Secondary | ICD-10-CM | POA: Diagnosis present

## 2023-08-11 LAB — HIV ANTIBODY (ROUTINE TESTING W REFLEX): HIV Screen 4th Generation wRfx: NONREACTIVE

## 2023-08-11 MED ORDER — METRONIDAZOLE 500 MG PO TABS: 500.0000 mg | ORAL_TABLET | Freq: Two times a day (BID) | ORAL | 0 refills | Status: DC

## 2023-08-11 NOTE — Discharge Instructions (Signed)
Your results will be available within 24 hours. If any additional treatment is warranted we will notify you via MyChart.

## 2023-08-11 NOTE — ED Provider Notes (Signed)
[facility]    CSN: [id number] Arrival date & time: 08/11/23  1544      History   Chief Complaint Chief Complaint  Patient presents with   SEXUALLY TRANSMITTED DISEASE    HPI Megan Nolan is a 37 y.o. female.   HPI Patient presents today for routine STD testing.  Patient was seen here at [facility] back in June after an exposure to chlamydia.  She was treated here in clinic with azithromycin and subsequently tested positive for trichomonas but reports that she did not complete the metronidazole that was prescribed to her.  She is having similar course of symptoms today.  She denies any knowledge of any new STD exposure. Denies nausea or vomiting. Past Medical History:  Diagnosis Date   Abnormal Pap smear    Bacterial vaginosis    Medical history non-contributory    No pertinent past medical history     Patient Active Problem List   Diagnosis Date Noted   Visit for routine gyn exam 03/17/2023   History of salpingectomy 03/17/2023   STD exposure 03/17/2023    Past Surgical History:  Procedure Laterality Date   CERVIX LESION DESTRUCTION     DIAGNOSTIC LAPAROSCOPY WITH REMOVAL OF ECTOPIC PREGNANCY N/A 11/08/2022   Procedure: DIAGNOSTIC LAPAROSCOPY WITH REMOVAL OF ECTOPIC PREGNANCY;  Surgeon: Lazaro Arms, MD;  Location: [facility];  Service: Gynecology;  Laterality: N/A;   WISDOM TOOTH EXTRACTION      OB History     Gravida  3   Para  1   Term  1   Preterm      AB  2   Living  1      SAB  0   IAB  1   Ectopic  1   Multiple      Live Births  1            Home Medications    Prior to Admission medications   Medication Sig Start Date End Date Taking? Authorizing Provider  metroNIDAZOLE (FLAGYL) 500 MG tablet Take 1 tablet (500 mg total) by mouth 2 (two) times daily. 08/11/23  Yes Bing Neighbors, NP    Family History Family History  Problem Relation Age of Onset   Cancer Maternal Grandmother    Cancer Paternal Grandmother      Social History Social History   Tobacco Use   Smoking status: Former    Current packs/day: 0.00    Types: Cigarettes    Quit date: 04/01/2012    Years since quitting: 11.3   Smokeless tobacco: Never  Vaping Use   Vaping status: Never Used  Substance Use Topics   Alcohol use: Yes    Comment: occasionally   Drug use: Not Currently    Types: Marijuana     Allergies   Patient has no known allergies.  Review of Systems Review of Systems Pertinent negatives listed in HPI   Physical Exam Triage Vital Signs ED Triage Vitals  Encounter Vitals Group     BP 08/11/23 1555 116/74     Systolic BP Percentile --      Diastolic BP Percentile --      Pulse Rate 08/11/23 1555 77     Resp 08/11/23 1555 18     Temp 08/11/23 1555 98.6 F (37 C)     Temp Source 08/11/23 1555 Oral     SpO2 08/11/23 1555 96 %     Weight --      Height --  Head Circumference --      Peak Flow --      Pain Score 08/11/23 1556 4     Pain Loc --      Pain Education --      Exclude from Growth Chart --    No data found.  Updated Vital Signs BP 116/74 (BP Location: Left Arm)   Pulse 77   Temp 98.6 F (37 C) (Oral)   Resp 18   LMP 08/02/2023   SpO2 96%   Visual Acuity Right Eye Distance:   Left Eye Distance:   Bilateral Distance:    Right Eye Near:   Left Eye Near:    Bilateral Near:     Physical Exam Vitals reviewed.  Constitutional:      Appearance: Normal appearance.  HENT:     Head: Normocephalic and atraumatic.  Eyes:     Extraocular Movements: Extraocular movements intact.     Conjunctiva/sclera: Conjunctivae normal.     Pupils: Pupils are equal, round, and reactive to light.  Cardiovascular:     Rate and Rhythm: Normal rate and regular rhythm.  Pulmonary:     Effort: Pulmonary effort is normal.     Breath sounds: Normal breath sounds.  Skin:    Capillary Refill: Capillary refill takes less than 2 seconds.  Neurological:     General: No focal deficit present.      Mental Status: She is alert and oriented to person, place, and time.   Cytology self collected  UC Treatments / Results  Labs (all labs ordered are listed, but only abnormal results are displayed) Labs Reviewed  HIV ANTIBODY (ROUTINE TESTING W REFLEX)  RPR  CERVICOVAGINAL ANCILLARY ONLY    EKG   Radiology No results found.  Procedures Procedures (including critical care time)  Medications Ordered in UC Medications - No data to display  Initial Impression / Assessment and Plan / UC Course  I have reviewed the triage vital signs and the nursing notes.  Pertinent labs & imaging results that were available during my care of the patient were reviewed by me and considered in my medical decision making (see chart for details).    Vaginitis and vulvovaginitis  Will cover with metronidazole as patient reports she did not complete her last treatment with metronidazole and tested positive for trichomonas. Encourage patient to have her partner treated as well as she can continually become infected with the same bacterium until her partner is treated.  Verbalized understanding and agreement with plan. Final Clinical Impressions(s) / UC Diagnoses   Final diagnoses:  Vaginitis and vulvovaginitis  Concern about STD in female without diagnosis     Discharge Instructions      Your results will be available within 24 hours. If any additional treatment is warranted we will notify you via MyChart.     ED Prescriptions     Medication Sig Dispense Auth. Provider   metroNIDAZOLE (FLAGYL) 500 MG tablet Take 1 tablet (500 mg total) by mouth 2 (two) times daily. 14 tablet Bing Neighbors, NP      PDMP not reviewed this encounter.   Bing Neighbors, NP 08/11/23 [phone]

## 2023-08-11 NOTE — ED Triage Notes (Signed)
Pt c/o vaginal discomfort during sexual intercourse and slight discharge/odor x3 days. States didn't complete tx last time she had chlamydia.

## 2023-08-12 ENCOUNTER — Telehealth: Payer: Self-pay

## 2023-08-12 LAB — CERVICOVAGINAL ANCILLARY ONLY
Bacterial Vaginitis (gardnerella): POSITIVE — AB
Candida Glabrata: NEGATIVE
Candida Vaginitis: POSITIVE — AB
Chlamydia: POSITIVE — AB
Comment: NEGATIVE
Comment: NEGATIVE
Comment: NEGATIVE
Comment: NEGATIVE
Comment: NEGATIVE
Comment: NORMAL
Neisseria Gonorrhea: NEGATIVE
Trichomonas: NEGATIVE

## 2023-08-12 LAB — RPR: RPR Ser Ql: NONREACTIVE

## 2023-08-12 MED ORDER — DOXYCYCLINE HYCLATE 100 MG PO CAPS: 100.0000 mg | ORAL_CAPSULE | Freq: Two times a day (BID) | ORAL | 0 refills | Status: DC

## 2023-08-12 MED ORDER — FLUCONAZOLE 150 MG PO TABS: 150.0000 mg | ORAL_TABLET | Freq: Once | ORAL | 0 refills | Status: AC

## 2023-08-12 NOTE — Telephone Encounter (Signed)
Contacted patient by phone.  Verified identity using two identifiers.  Provided positive result.  Reviewed safe sex practices, notifying partners, and refraining from sexual activities for 7 days from time of treatment.  Patient verified understanding, all questions answered.    Pt requires tx with Doxycycline and Diflucan.  Reviewed with patient, verified pharmacy, prescription sent.

## 2023-11-10 ENCOUNTER — Inpatient Hospital Stay (HOSPITAL_COMMUNITY)
Admission: AD | Admit: 2023-11-10 | Discharge: 2023-11-10 | Discharge status: Against Medical Advice | Payer: Medicaid Other | Attending: Obstetrics and Gynecology | Admitting: Obstetrics and Gynecology

## 2023-11-16 NOTE — L&D Delivery Note (Signed)
 [facility] Delivery Note  Megan Nolan is a 38 y.o. [id number] s/p NSVD at [redacted]w[redacted]d. She was admitted for early labor and SROM.   ROM: 16h 47m with light meconium fluid GBS Status: positive Maximum Maternal Temperature: 98.84F  Labor Progress: Progressed to complete following augmentation with Pitocin.  Delivery Date/Time: 08/17/24 @ 1417 Delivery: Called to room because patient found to be complete and is involuntarily pushing. Patient actively pushed with 3 contractions and head delivered LOA. No nuchal cord present. Shoulder and body delivered in usual fashion. Thick meconium at delivery. Infant with spontaneous cry, placed on mother's abdomen, dried and stimulated. Cord clamped x 2 after 1-minute delay, and cut by FOB. Due to weak cry, baby taken to warmer for evaluation by RN and returned skin to skin with patient prior to SNM leaving the room. Cord blood collected. Brisk bleeding immediately after delivery. No obvious lacerations. Placenta delivered spontaneously, intact, with 3-vessel cord. Fundus firm with massage and Pitocin. Bleeding resolved. Labia, perineum, vagina, and cervix inspected and found to be intact.  Placenta: intact, to L&D Complications: none Lacerations: none EBL: Analgesia: epidural  Infant: female  APGARs 7,9  weight pending  Vernell Ruddle, [facility] 08/17/2024 3:02 PM

## 2023-12-06 ENCOUNTER — Other Ambulatory Visit: Payer: Self-pay

## 2023-12-06 ENCOUNTER — Emergency Department
Admission: EM | Admit: 2023-12-06 | Discharge: 2023-12-06 | Disposition: A | Discharge status: Home / Self Care | Payer: Medicaid Other | Attending: Emergency Medicine | Admitting: Emergency Medicine

## 2023-12-06 DIAGNOSIS — O219 Vomiting of pregnancy, unspecified: Secondary | ICD-10-CM | POA: Diagnosis present

## 2023-12-06 DIAGNOSIS — Z3A00 Weeks of gestation of pregnancy not specified: Secondary | ICD-10-CM | POA: Diagnosis not present

## 2023-12-06 LAB — CBC WITH DIFFERENTIAL/PLATELET
Abs Immature Granulocytes: 0.02 10*3/uL (ref 0.00–0.07)
Basophils Absolute: 0 10*3/uL (ref 0.0–0.1)
Basophils Relative: 0 %
Eosinophils Absolute: 0 10*3/uL (ref 0.0–0.5)
Eosinophils Relative: 0 %
HCT: 31.2 % — ABNORMAL LOW (ref 36.0–46.0)
Hemoglobin: 10.7 g/dL — ABNORMAL LOW (ref 12.0–15.0)
Immature Granulocytes: 0 %
Lymphocytes Relative: 27 %
Lymphs Abs: 3 10*3/uL (ref 0.7–4.0)
MCH: 35.2 pg — ABNORMAL HIGH (ref 26.0–34.0)
MCHC: 34.3 g/dL (ref 30.0–36.0)
MCV: 102.6 fL — ABNORMAL HIGH (ref 80.0–100.0)
Monocytes Absolute: 0.6 10*3/uL (ref 0.1–1.0)
Monocytes Relative: 5 %
Neutro Abs: 7.3 10*3/uL (ref 1.7–7.7)
Neutrophils Relative %: 68 %
Platelets: 255 10*3/uL (ref 150–400)
RBC: 3.04 MIL/uL — ABNORMAL LOW (ref 3.87–5.11)
RDW: 11.8 % (ref 11.5–15.5)
WBC: 10.9 10*3/uL — ABNORMAL HIGH (ref 4.0–10.5)
nRBC: 0 % (ref 0.0–0.2)

## 2023-12-06 LAB — BASIC METABOLIC PANEL
Anion gap: 5 (ref 5–15)
BUN: 12 mg/dL (ref 6–20)
CO2: 20 mmol/L — ABNORMAL LOW (ref 22–32)
Calcium: 8.8 mg/dL — ABNORMAL LOW (ref 8.9–10.3)
Chloride: 108 mmol/L (ref 98–111)
Creatinine, Ser: 0.65 mg/dL (ref 0.44–1.00)
GFR, Estimated: >60 mL/min (ref >60)
Glucose, Bld: 124 mg/dL — ABNORMAL HIGH (ref 70–99)
Potassium: 3 mmol/L — ABNORMAL LOW (ref 3.5–5.1)
Sodium: 133 mmol/L — ABNORMAL LOW (ref 135–145)

## 2023-12-06 LAB — HCG, QUANTITATIVE, PREGNANCY: hCG, Beta Chain, Quant, S: 590 m[IU]/mL — ABNORMAL HIGH (ref <5)

## 2023-12-06 LAB — URINALYSIS, ROUTINE W REFLEX MICROSCOPIC
Bacteria, UA: NONE SEEN
Bilirubin Urine: NEGATIVE
Glucose, UA: NEGATIVE mg/dL
Ketones, ur: NEGATIVE mg/dL
Leukocytes,Ua: NEGATIVE
Nitrite: NEGATIVE
Protein, ur: NEGATIVE mg/dL
Specific Gravity, Urine: 1.024 (ref 1.005–1.030)
pH: 6 (ref 5.0–8.0)

## 2023-12-06 LAB — POC URINE PREG, ED: Preg Test, Ur: POSITIVE — AB

## 2023-12-06 MED ORDER — ONDANSETRON 4 MG PO TBDP
4.0000 mg | ORAL_TABLET | Freq: Once | ORAL | Status: AC
  Administered 2023-12-06: 4 mg via ORAL
  Filled 2023-12-06: qty 1

## 2023-12-06 NOTE — ED Notes (Signed)
Pt went up to this RN stating that she was leaving and the wait was too long. This RN verified that she was seen by provider and was given medication. Pt insistent on leaving. Romeo Apple PA to be notified

## 2023-12-06 NOTE — ED Triage Notes (Signed)
Pt states she took a pregnancy test today and it was + pt reports LMP was sometime in December. Pt reports over past few days she has had n/v

## 2023-12-07 NOTE — ED Provider Notes (Signed)
[facility] Emergency Department Provider Note     Event Date/Time   First MD Initiated Contact with Patient 12/06/23 2220     (approximate)   History   Emesis   HPI  Megan Nolan is a 38 y.o. female presents to the ED for evaluation of nausea and vomiting x 2 days.  Patient reports she took an at home pregnancy test and was positive.  Patient reports recent ectopic pregnancy 10/2022.  She denies any abdominal pain, vaginal bleeding or fever.  Patient simply here for nausea and verification of pregnancy.     Physical Exam   Triage Vital Signs: ED Triage Vitals  Encounter Vitals Group     BP 12/06/23 2115 120/77     Systolic BP Percentile --      Diastolic BP Percentile --      Pulse Rate 12/06/23 2115 89     Resp 12/06/23 2115 18     Temp 12/06/23 2115 98.2 F (36.8 C)     Temp Source 12/06/23 2115 Oral     SpO2 12/06/23 2115 98 %     Weight 12/06/23 2116 150 lb (68 kg)     Height 12/06/23 2116 5\' 6"  (1.676 m)     Head Circumference --      Peak Flow --      Pain Score --      Pain Loc --      Pain Education --      Exclude from Growth Chart --     Most recent vital signs: Vitals:   12/06/23 2115  BP: 120/77  Pulse: 89  Resp: 18  Temp: 98.2 F (36.8 C)  SpO2: 98%    General Awake, no distress.  Sitting comfortably. HEENT NCAT. PERRL. EOMI. No rhinorrhea. Mucous membranes are moist.  CV:  Good peripheral perfusion.  RESP:  Normal effort.  ABD:  No distention.   ED Results / Procedures / Treatments   Labs (all labs ordered are listed, but only abnormal results are displayed) Labs Reviewed  CBC WITH DIFFERENTIAL/PLATELET - Abnormal; Notable for the following components:      Result Value   WBC 10.9 (*)    RBC 3.04 (*)    Hemoglobin 10.7 (*)    HCT 31.2 (*)    MCV 102.6 (*)    MCH 35.2 (*)    All other components within normal limits  BASIC METABOLIC PANEL - Abnormal; Notable for the following components:    Sodium 133 (*)    Potassium 3.0 (*)    CO2 20 (*)    Glucose, Bld 124 (*)    Calcium 8.8 (*)    All other components within normal limits  HCG, QUANTITATIVE, PREGNANCY - Abnormal; Notable for the following components:   hCG, Beta Chain, Quant, S 590 (*)    All other components within normal limits  URINALYSIS, ROUTINE W REFLEX MICROSCOPIC - Abnormal; Notable for the following components:   Color, Urine YELLOW (*)    APPearance HAZY (*)    Hgb urine dipstick SMALL (*)    All other components within normal limits  POC URINE PREG, ED - Abnormal; Notable for the following components:   Preg Test, Ur POSITIVE (*)    All other components within normal limits    No results found.  PROCEDURES:  Critical Care performed: No  Procedures   MEDICATIONS ORDERED IN ED: Medications  ondansetron (ZOFRAN-ODT) disintegrating tablet 4 mg (4 mg Oral Given 12/06/23  2252)   IMPRESSION / MDM / ASSESSMENT AND PLAN / ED COURSE  I reviewed the triage vital signs and the nursing notes.                               39 y.o. female presents to the emergency department for evaluation and treatment of positive home pregnancy test and nausea. See HPI for further details.   Differential diagnosis includes, but is not limited to nausea vomiting due to early pregnancy, normal pregnancy, pregnancy screening  Patient's presentation is most consistent with acute complicated illness / injury requiring diagnostic workup.  Patient's presentation is clinically consistent with nausea during early pregnancy.  Patient became impatient with wait time and requesting something for nausea.  Will administer Zofran while awaiting labs.  POC pregnancy is positive, awaiting hCG.  At this time urinalysis is normal with the exception of small amount of Hgb.  Electrolytes are mildly low this is consistent with history of nausea and vomiting.  CBC is pertinent for a hemoglobin of 10.7.   Made aware by RN nurse became impatient  due to extended wait time and left without completing care.  FINAL CLINICAL IMPRESSION(S) / ED DIAGNOSES   Final diagnoses:  Nausea/vomiting in pregnancy   Rx / DC Orders   ED Discharge Orders     None       Note:  This document was prepared using Dragon voice recognition software and may include unintentional dictation errors.    Romeo Apple, Thorsten Climer A, PA-C 12/07/23 2154    Merwyn Katos, MD 12/14/23 [phone]

## 2023-12-13 ENCOUNTER — Inpatient Hospital Stay (HOSPITAL_COMMUNITY): Payer: Medicaid Other

## 2023-12-13 ENCOUNTER — Inpatient Hospital Stay (HOSPITAL_COMMUNITY)
Admission: AD | Admit: 2023-12-13 | Discharge: 2023-12-13 | Disposition: A | Discharge status: Home / Self Care | Payer: Medicaid Other | Attending: Obstetrics and Gynecology | Admitting: Obstetrics and Gynecology

## 2023-12-13 ENCOUNTER — Encounter (HOSPITAL_COMMUNITY): Payer: Self-pay | Admitting: *Deleted

## 2023-12-13 DIAGNOSIS — O3481 Maternal care for other abnormalities of pelvic organs, first trimester: Secondary | ICD-10-CM | POA: Insufficient documentation

## 2023-12-13 DIAGNOSIS — Z3A01 Less than 8 weeks gestation of pregnancy: Secondary | ICD-10-CM | POA: Insufficient documentation

## 2023-12-13 DIAGNOSIS — N8312 Corpus luteum cyst of left ovary: Secondary | ICD-10-CM | POA: Insufficient documentation

## 2023-12-13 DIAGNOSIS — O3680X0 Pregnancy with inconclusive fetal viability, not applicable or unspecified: Secondary | ICD-10-CM | POA: Insufficient documentation

## 2023-12-13 DIAGNOSIS — O09291 Supervision of pregnancy with other poor reproductive or obstetric history, first trimester: Secondary | ICD-10-CM | POA: Insufficient documentation

## 2023-12-13 DIAGNOSIS — Z3490 Encounter for supervision of normal pregnancy, unspecified, unspecified trimester: Secondary | ICD-10-CM

## 2023-12-13 LAB — ABO/RH: ABO/RH(D): O POS

## 2023-12-13 LAB — CBC
HCT: 32.9 % — ABNORMAL LOW (ref 36.0–46.0)
Hemoglobin: 11.3 g/dL — ABNORMAL LOW (ref 12.0–15.0)
MCH: 35.3 pg — ABNORMAL HIGH (ref 26.0–34.0)
MCHC: 34.3 g/dL (ref 30.0–36.0)
MCV: 102.8 fL — ABNORMAL HIGH (ref 80.0–100.0)
Platelets: 288 10*3/uL (ref 150–400)
RBC: 3.2 MIL/uL — ABNORMAL LOW (ref 3.87–5.11)
RDW: 12.1 % (ref 11.5–15.5)
WBC: 11.5 10*3/uL — ABNORMAL HIGH (ref 4.0–10.5)
nRBC: 0 % (ref 0.0–0.2)

## 2023-12-13 LAB — WET PREP, GENITAL
Clue Cells Wet Prep HPF POC: NONE SEEN
Sperm: NONE SEEN
Trich, Wet Prep: NONE SEEN
WBC, Wet Prep HPF POC: <10 (ref <10)
Yeast Wet Prep HPF POC: NONE SEEN

## 2023-12-13 LAB — URINALYSIS, ROUTINE W REFLEX MICROSCOPIC
Bilirubin Urine: NEGATIVE
Glucose, UA: NEGATIVE mg/dL
Hgb urine dipstick: NEGATIVE
Ketones, ur: NEGATIVE mg/dL
Leukocytes,Ua: NEGATIVE
Nitrite: NEGATIVE
Protein, ur: NEGATIVE mg/dL
Specific Gravity, Urine: 1.027 (ref 1.005–1.030)
pH: 5 (ref 5.0–8.0)

## 2023-12-13 LAB — HCG, QUANTITATIVE, PREGNANCY: hCG, Beta Chain, Quant, S: 7685 m[IU]/mL — ABNORMAL HIGH (ref <5)

## 2023-12-13 MED ORDER — DOXYLAMINE-PYRIDOXINE 10-10 MG PO TBEC: DELAYED_RELEASE_TABLET | ORAL | 0 refills | Status: DC

## 2023-12-13 MED ORDER — ONDANSETRON HCL 4 MG PO TABS: 4.0000 mg | ORAL_TABLET | Freq: Three times a day (TID) | ORAL | 0 refills | Status: DC | PRN

## 2023-12-13 MED ORDER — ONDANSETRON 4 MG PO TBDP
8.0000 mg | ORAL_TABLET | Freq: Once | ORAL | Status: AC
  Administered 2023-12-13: 8 mg via ORAL
  Filled 2023-12-13: qty 2

## 2023-12-13 NOTE — MAU Note (Addendum)
Py says she has lower abd cramps- started on 12-06-2023- no meds for cramps  No VB NO BC Last sex- yesterday  No unusual D/C Went to Urgent care on 12-06-2023- had labs and urine- says she left and didn't get results

## 2023-12-13 NOTE — Discharge Instructions (Addendum)
You were seen tonight for early confirmation of pregnancy. It was determined that you have a pregnancy at an early stage in your uterus. It is recommended that you follow up with a viability scan at your preferred OB provider in 10-14 days. There is a list of OB providers in [place] that have privileges at [facility] below. I've also attached a list of medications that are safe in pregnancy, listed by symptom. I have also prescribed Diclegis for nausea in pregnancy, and Zofran if nausea is not responsive to the Diclegis.   Thank you for trusting [place] to care for you, Megan Nolan, Megan Nolan  [facility]/Gyn Providers   Center for [employer] [facility] [employer] [facility]             [address], [place], [place] [address] [phone]  Center for [facility] at [facility]                                                             [address], [place], [place], [address] [phone]  Center for [facility] at [facility] [address], [place], [place], [address] [phone]  Center for [facility] at [facility] [address], [place], [place], [address] [phone]  Center for [facility] at [facility]                                 [address] [place], [place], [place], [address] [phone]  Center for [facility] at [facility]                                    [address], [place], [place], [address] [phone]  Center for [facility] at [facility] [address], [place], [place], [address]                              [facility] [address], [address], [place], [place], [address] [phone]  [facility]         Phone: [phone]  [facility]      Phone: [phone]   [facility]      Phone: [phone]  [facility]  Department-Family Planning         Phone: [phone]   [facility]-Maternity    Phone: [phone]  [facility]      Phone: [phone]  [facility]     Phone: [phone]  [facility]        Phone: [phone]  [facility]) [phone]  [facility]      Phone: [phone]   Safe Medications in Pregnancy   Acne:  Benzoyl Peroxide  Salicylic Acid   Backache/Headache:  Tylenol: 2 regular strength every 4 hours OR  2 Extra strength every 6 hours   Colds/Coughs/Allergies:  Benadryl (alcohol free) 25 mg every 6 hours as needed  Breath right strips  Claritin  Cepacol throat lozenges  Chloraseptic throat spray  Cold-Eeze- up to three times per day  Cough drops, alcohol free  Flonase (by prescription only)  Guaifenesin  Mucinex  Robitussin DM (plain only, alcohol free)  Saline nasal spray/drops  Sudafed (pseudoephedrine) & Actifed * use only after [redacted] weeks gestation and if you do not have high blood pressure  Tylenol  Vicks Vaporub  Zinc lozenges  Zyrtec   Constipation:  Colace  Ducolax suppositories  Fleet enema  Glycerin suppositories  Metamucil  Milk of magnesia  Miralax  Senokot  Smooth move tea   Diarrhea:  Kaopectate  Imodium A-D   *NO pepto Bismol   Hemorrhoids:  Anusol  Anusol HC  Preparation H  Tucks   Indigestion:  Tums  Maalox  Mylanta  Zantac  Pepcid   Insomnia:  Benadryl (alcohol free) 25mg  every 6 hours as needed  Tylenol PM  Unisom, no Gelcaps   Leg Cramps:  Tums  MagGel   Nausea/Vomiting:  Bonine  Dramamine  Emetrol  Ginger extract  Sea bands  Meclizine  Nausea medication to take during pregnancy:  Unisom (doxylamine succinate 25 mg tablets) Take one tablet daily at bedtime. If symptoms are not adequately controlled, the dose can be increased to a maximum recommended  dose of two tablets daily (1/2 tablet in the morning, 1/2 tablet mid-afternoon and one at bedtime).  Vitamin B6 100mg  tablets. Take one tablet twice a day (up to 200 mg per day).   Skin Rashes:  Aveeno products  Benadryl cream or 25mg  every 6 hours as needed  Calamine Lotion  1% cortisone cream   Yeast infection:  Gyne-lotrimin 7  Monistat 7    **If taking multiple medications, please check labels to avoid duplicating the same active ingredients  **take medication as directed on the label  ** Do not exceed 4000 mg of tylenol in 24 hours  **Do not take medications that contain aspirin or ibuprofen

## 2023-12-13 NOTE — MAU Provider Note (Signed)
Chief Complaint:  Abdominal Pain   HPI   None     Megan Nolan is a 38 y.o. [phone] at [redacted]w[redacted]d who presents to maternity admissions reporting abdominal pain with nausea and vomiting. Reports she is unsure of LMP dates, thinks it was sometime in mid-November. Reports she was seen at [facility] one week ago, left before they completed the workup. Reports a history of ectopic with salpingectomy 10/2022. Reports she is vomiting 3-4x daily. Has not yet established care.   Pregnancy Course: New pregnancy  Past Medical History:  Diagnosis Date   Abnormal Pap smear    Bacterial vaginosis    Medical history non-contributory    No pertinent past medical history    OB History  Gravida Para Term Preterm AB Living  4 1 1  2 1   SAB IAB Ectopic Multiple Live Births  0 1 1  1     # Outcome Date GA Lbr Len/2nd Weight Sex Type Anes PTL Lv  4 Current           3 Ectopic 10/2022          2 Term 06/03/07 [redacted]w[redacted]d  3147 g F Vag-Spont EPI  LIV     Birth Comments: No complication  1 IAB            Past Surgical History:  Procedure Laterality Date   CERVIX LESION DESTRUCTION     DIAGNOSTIC LAPAROSCOPY WITH REMOVAL OF ECTOPIC PREGNANCY N/A 11/08/2022   Procedure: DIAGNOSTIC LAPAROSCOPY WITH REMOVAL OF ECTOPIC PREGNANCY;  Surgeon: Lazaro Arms, MD;  Location: [facility] OR;  Service: Gynecology;  Laterality: N/A;   WISDOM TOOTH EXTRACTION     Family History  Problem Relation Age of Onset   Cancer Maternal Grandmother    Cancer Paternal Grandmother    Social History   Tobacco Use   Smoking status: Former    Current packs/day: 0.00    Types: Cigarettes    Quit date: 04/01/2012    Years since quitting: 11.7   Smokeless tobacco: Never  Vaping Use   Vaping status: Never Used  Substance Use Topics   Alcohol use: Yes    Comment: occasionally   Drug use: Not Currently    Types: Marijuana   No Known Allergies No medications prior to admission.    I have reviewed patient's Past Medical Hx, Surgical  Hx, Family Hx, Social Hx, medications and allergies.   ROS  Pertinent items noted in HPI and remainder of comprehensive ROS otherwise negative.   PHYSICAL EXAM  Patient Vitals for the past 24 hrs:  BP Temp Temp src Pulse Resp Height Weight  12/13/23 1954 112/68 98 F (36.7 C) Oral 76 16 5' 6.5" (1.689 m) 65 kg    Constitutional: Well-developed, well-nourished female in no acute distress.  Cardiovascular: normal rate & rhythm, warm and well-perfused Respiratory: normal effort, no problems with respiration noted GI: Abd soft, non-tender, non-distended MS: Extremities nontender, no edema, normal ROM Neurologic: Alert and oriented x 4.  GU: no CVA tenderness  Labs: Results for orders placed or performed during the hospital encounter of 12/13/23 (from the past 24 hours)  Wet prep, genital     Status: None   Collection Time: 12/13/23  8:08 PM  Result Value Ref Range   Yeast Wet Prep HPF POC NONE SEEN NONE SEEN   Trich, Wet Prep NONE SEEN NONE SEEN   Clue Cells Wet Prep HPF POC NONE SEEN NONE SEEN   WBC, Wet Prep HPF POC <10 <  10   Sperm NONE SEEN   Urinalysis, Routine w reflex microscopic -Urine, Clean Catch     Status: Abnormal   Collection Time: 12/13/23  8:58 PM  Result Value Ref Range   Color, Urine YELLOW YELLOW   APPearance HAZY (A) CLEAR   Specific Gravity, Urine 1.027 1.005 - 1.030   pH 5.0 5.0 - 8.0   Glucose, UA NEGATIVE NEGATIVE mg/dL   Hgb urine dipstick NEGATIVE NEGATIVE   Bilirubin Urine NEGATIVE NEGATIVE   Ketones, ur NEGATIVE NEGATIVE mg/dL   Protein, ur NEGATIVE NEGATIVE mg/dL   Nitrite NEGATIVE NEGATIVE   Leukocytes,Ua NEGATIVE NEGATIVE  CBC     Status: Abnormal   Collection Time: 12/13/23  9:15 PM  Result Value Ref Range   WBC 11.5 (H) 4.0 - 10.5 K/uL   RBC 3.20 (L) 3.87 - 5.11 MIL/uL   Hemoglobin 11.3 (L) 12.0 - 15.0 g/dL   HCT 57.8 (L) 46.9 - 62.9 %   MCV 102.8 (H) 80.0 - 100.0 fL   MCH 35.3 (H) 26.0 - 34.0 pg   MCHC 34.3 30.0 - 36.0 g/dL   RDW 52.8  41.3 - 24.4 %   Platelets 288 150 - 400 K/uL   nRBC 0.0 0.0 - 0.2 %  hCG, quantitative, pregnancy     Status: Abnormal   Collection Time: 12/13/23  9:15 PM  Result Value Ref Range   hCG, Beta Chain, Quant, S 7,685 (H) <5 mIU/mL  ABO/Rh     Status: None   Collection Time: 12/13/23  9:18 PM  Result Value Ref Range   ABO/RH(D)      O POS Performed at [facility], [address] [place], [place] [address]     Imaging:  US OB LESS THAN 14 WEEKS WITH OB TRANSVAGINAL Result Date: 12/13/2023 CLINICAL DATA:  Positive pregnancy test, history of ectopic pregnancy EXAM: OBSTETRIC <14 WK [place] AND TRANSVAGINAL OB US TECHNIQUE: Both transabdominal and transvaginal ultrasound examinations were performed for complete evaluation of the gestation as well as the maternal uterus, adnexal regions, and pelvic cul-de-sac. Transvaginal technique was performed to assess early pregnancy. COMPARISON:  None Available. FINDINGS: Intrauterine gestational sac: Present Yolk sac:  Present Embryo:  Absent MSD: 7.6 mm   5 w   4 d Maternal uterus/adnexae: Ovaries are within normal limits. Corpus luteum cyst is noted in the left ovary. IMPRESSION: Probable early intrauterine gestational sac and yolk sac but no fetal pole, or cardiac activity yet visualized. Recommend follow-up quantitative B-HCG levels and follow-up US in 14 days to assess viability. This recommendation follows SRU consensus guidelines: Diagnostic Criteria for Nonviable Pregnancy Early in the First Trimester. Malva Limes Med 2013; 253:6644-03. Electronically Signed   By: Alcide Clever M.D.   On: 12/13/2023 21:59    MDM & MAU COURSE  MDM: [place] workup   MAU Course: Orders Placed This Encounter  Procedures   Wet prep, genital   US OB LESS THAN 14 WEEKS WITH OB TRANSVAGINAL   Urinalysis, Routine w reflex microscopic -Urine, Clean Catch   CBC   hCG, quantitative, pregnancy   Diet NPO time specified   ABO/Rh   Discharge patient   Meds ordered this  encounter  Medications   ondansetron (ZOFRAN-ODT) disintegrating tablet 8 mg   Doxylamine-Pyridoxine 10-10 MG TBEC    Sig: Take 2 tabs at bedtime. If needed, add another tab in the morning. If needed, add another tab in the afternoon, up to 4 tabs/day.    Dispense:  120 tablet    Refill:  0    DAW9    Supervising Provider:   Reva Bores [[id number]]   ondansetron (ZOFRAN) 4 MG tablet    Sig: Take 1 tablet (4 mg total) by mouth every 8 (eight) hours as needed for nausea or vomiting.    Dispense:  20 tablet    Refill:  0    Supervising Provider:   Reva Bores [[id number]]    ASSESSMENT   1. Early stage of pregnancy   2. Intrauterine pregnancy    Early stage of pregnancy, unknown LMP. Given previous beta Hcg level of 590 when seen at [place], unlikely [redacted] weeks gestation. Appropriate rise in betaHcG, and intrauterine pregnancy noted.  PLAN  - Reassurance provided. Appropriate betaHcG rise and IUP noted on [place]. - Recommended viability [place] in 10-14 days, establish care with OB office. List provided. - Zofran and diclegis sent to preferred pharmacy.  - Safe medications in pregnancy list provided.  Discharge home in stable condition with return precautions.      Allergies as of 12/13/2023   No Known Allergies      Medication List     STOP taking these medications    doxycycline 100 MG capsule Commonly known as: VIBRAMYCIN   metroNIDAZOLE 500 MG tablet Commonly known as: FLAGYL       TAKE these medications    Doxylamine-Pyridoxine 10-10 MG Tbec Take 2 tabs at bedtime. If needed, add another tab in the morning. If needed, add another tab in the afternoon, up to 4 tabs/day.   ondansetron 4 MG tablet Commonly known as: Zofran Take 1 tablet (4 mg total) by mouth every 8 (eight) hours as needed for nausea or vomiting.       Lamont Snowball, MSN, CNM, RNC-OB Certified Nurse Midwife, [facility] 12/14/2023 12:47 AM

## 2023-12-14 LAB — GC/CHLAMYDIA PROBE AMP (~~LOC~~) NOT AT ARMC
Chlamydia: NEGATIVE
Comment: NEGATIVE
Comment: NORMAL
Neisseria Gonorrhea: NEGATIVE

## 2023-12-15 ENCOUNTER — Ambulatory Visit
Admission: EM | Admit: 2023-12-15 | Discharge: 2023-12-15 | Disposition: A | Discharge status: Home / Self Care | Payer: Medicaid Other | Attending: Family Medicine | Admitting: Family Medicine

## 2023-12-15 ENCOUNTER — Other Ambulatory Visit: Payer: Self-pay

## 2023-12-15 DIAGNOSIS — J09X2 Influenza due to identified novel influenza A virus with other respiratory manifestations: Secondary | ICD-10-CM | POA: Diagnosis not present

## 2023-12-15 DIAGNOSIS — R6889 Other general symptoms and signs: Secondary | ICD-10-CM

## 2023-12-15 LAB — POCT INFLUENZA A/B
Influenza A, POC: POSITIVE — AB
Influenza B, POC: NEGATIVE

## 2023-12-15 MED ORDER — OSELTAMIVIR PHOSPHATE 75 MG PO CAPS: 75.0000 mg | ORAL_CAPSULE | Freq: Two times a day (BID) | ORAL | 0 refills | Status: AC

## 2023-12-15 NOTE — ED Notes (Signed)
Work note provided.

## 2023-12-15 NOTE — ED Provider Notes (Addendum)
[facility]    CSN: [id number] Arrival date & time: 12/15/23  8295      History   Chief Complaint Chief Complaint  Patient presents with   Cough    HPI Megan Nolan is a 38 y.o. female.    Cough  Well for 2 days started with nasal congestion yesterday developed fever, rhinorrhea, sore throat, cough, is, fatigue.  EDC 07/06/2024 G4, P1 A2 Admits recent exposure to someone with flu Admits chills, body aches, fatigue, nausea, vomiting, diarrhea, headache.  Denies ear pain, swollen glands, abdominal pain, shortness of breath, vaginal discharge or bleeding.  Past Medical History:  Diagnosis Date   Abnormal Pap smear    Bacterial vaginosis    Medical history non-contributory    No pertinent past medical history     Patient Active Problem List   Diagnosis Date Noted   Visit for routine gyn exam 03/17/2023   History of salpingectomy 03/17/2023   STD exposure 03/17/2023    Past Surgical History:  Procedure Laterality Date   CERVIX LESION DESTRUCTION     DIAGNOSTIC LAPAROSCOPY WITH REMOVAL OF ECTOPIC PREGNANCY N/A 11/08/2022   Procedure: DIAGNOSTIC LAPAROSCOPY WITH REMOVAL OF ECTOPIC PREGNANCY;  Surgeon: Lazaro Arms, MD;  Location: [facility] OR;  Service: Gynecology;  Laterality: N/A;   WISDOM TOOTH EXTRACTION      OB History     Gravida  4   Para  1   Term  1   Preterm      AB  2   Living  1      SAB  0   IAB  1   Ectopic  1   Multiple      Live Births  1            Home Medications    Prior to Admission medications   Medication Sig Start Date End Date Taking? Authorizing Provider  oseltamivir (TAMIFLU) 75 MG capsule Take 1 capsule (75 mg total) by mouth 2 (two) times daily for 5 days. 12/15/23 12/20/23 Yes Meliton Rattan, PA  Doxylamine-Pyridoxine 10-10 MG TBEC Take 2 tabs at bedtime. If needed, add another tab in the morning. If needed, add another tab in the afternoon, up to 4 tabs/day. 12/13/23   [place], Clarita Crane, CNM   ondansetron (ZOFRAN) 4 MG tablet Take 1 tablet (4 mg total) by mouth every 8 (eight) hours as needed for nausea or vomiting. 12/13/23   Jonah Blue, Clarita Crane, CNM    Family History Family History  Problem Relation Age of Onset   Cancer Maternal Grandmother    Cancer Paternal Grandmother     Social History Social History   Tobacco Use   Smoking status: Former    Current packs/day: 0.00    Types: Cigarettes    Quit date: 04/01/2012    Years since quitting: 11.7   Smokeless tobacco: Never  Vaping Use   Vaping status: Never Used  Substance Use Topics   Alcohol use: Yes    Comment: occasionally   Drug use: Not Currently    Types: Marijuana     Allergies   Patient has no known allergies.   Review of Systems Review of Systems  Respiratory:  Positive for cough.      Physical Exam Triage Vital Signs ED Triage Vitals  Encounter Vitals Group     BP 12/15/23 0945 135/83     Systolic BP Percentile --      Diastolic BP Percentile --  Pulse Rate 12/15/23 0945 97     Resp 12/15/23 0945 18     Temp 12/15/23 0945 98.3 F (36.8 C)     Temp Source 12/15/23 0945 Oral     SpO2 12/15/23 0945 96 %     Weight --      Height --      Head Circumference --      Peak Flow --      Pain Score 12/15/23 0955 9     Pain Loc --      Pain Education --      Exclude from Growth Chart --    No data found.  Updated Vital Signs BP 135/83 (BP Location: Right Arm)   Pulse 97   Temp 98.3 F (36.8 C) (Oral)   Resp 18   LMP 09/30/2023   SpO2 96%   Visual Acuity Right Eye Distance:   Left Eye Distance:   Bilateral Distance:    Right Eye Near:   Left Eye Near:    Bilateral Near:     Physical Exam Vitals and nursing note reviewed.  Constitutional:      Appearance: She is not ill-appearing.  HENT:     Head: Normocephalic and atraumatic.     Right Ear: Tympanic membrane and ear canal normal.     Left Ear: Tympanic membrane normal.     Nose: Congestion present. No rhinorrhea.      Mouth/Throat:     Mouth: Mucous membranes are moist.     Pharynx: No oropharyngeal exudate or posterior oropharyngeal erythema.  Eyes:     Conjunctiva/sclera: Conjunctivae normal.  Cardiovascular:     Rate and Rhythm: Normal rate and regular rhythm.  Pulmonary:     Effort: Pulmonary effort is normal. No respiratory distress.     Breath sounds: Normal breath sounds. No wheezing or rales.  Musculoskeletal:     Cervical back: Neck supple.  Lymphadenopathy:     Cervical: No cervical adenopathy.  Skin:    General: Skin is warm and dry.  Neurological:     Mental Status: She is oriented to person, place, and time.  Psychiatric:        Mood and Affect: Mood normal.      UC Treatments / Results  Labs (all labs ordered are listed, but only abnormal results are displayed) Labs Reviewed  POCT INFLUENZA A/B    EKG   Radiology US OB LESS THAN 14 WEEKS WITH OB TRANSVAGINAL Result Date: 12/13/2023 CLINICAL DATA:  Positive pregnancy test, history of ectopic pregnancy EXAM: OBSTETRIC <14 WK [place] AND TRANSVAGINAL OB US TECHNIQUE: Both transabdominal and transvaginal ultrasound examinations were performed for complete evaluation of the gestation as well as the maternal uterus, adnexal regions, and pelvic cul-de-sac. Transvaginal technique was performed to assess early pregnancy. COMPARISON:  None Available. FINDINGS: Intrauterine gestational sac: Present Yolk sac:  Present Embryo:  Absent MSD: 7.6 mm   5 w   4 d Maternal uterus/adnexae: Ovaries are within normal limits. Corpus luteum cyst is noted in the left ovary. IMPRESSION: Probable early intrauterine gestational sac and yolk sac but no fetal pole, or cardiac activity yet visualized. Recommend follow-up quantitative B-HCG levels and follow-up US in 14 days to assess viability. This recommendation follows SRU consensus guidelines: Diagnostic Criteria for Nonviable Pregnancy Early in the First Trimester. Malva Limes Med 2013; 956:2130-86.  Electronically Signed   By: Alcide Clever M.D.   On: 12/13/2023 21:59    Procedures Procedures (including critical care time)  Medications Ordered in UC Medications - No data to display  Initial Impression / Assessment and Plan / UC Course  I have reviewed the triage vital signs and the nursing notes.  Pertinent labs & imaging results that were available during my care of the patient were reviewed by me and considered in my medical decision making (see chart for details).     38 year old female currently pregnant presents with flulike symptoms for 2 days, she is well-appearing, nontoxic, vital signs are stable.  Point-of-care flu positive for influenza A Rx Tamiflu sent to pharmacy, patient given list of medications considered during pregnancy  Final Clinical Impressions(s) / UC Diagnoses   Final diagnoses:  Flu-like symptoms  Influenza due to identified novel influenza A virus with other respiratory manifestations   Discharge Instructions   None    ED Prescriptions     Medication Sig Dispense Auth. Provider   oseltamivir (TAMIFLU) 75 MG capsule Take 1 capsule (75 mg total) by mouth 2 (two) times daily for 5 days. 10 capsule Meliton Rattan, PA      PDMP not reviewed this encounter.   Meliton Rattan, PA 12/15/23 1014    Ruben Reason [address], [place] 12/15/23 1015

## 2023-12-15 NOTE — ED Triage Notes (Addendum)
Pt presents with complaints of flu-like symptoms x 3 days. Pt is reporting cough, nasal congestion, generalized body aches, nausea/vomiting/diarrhea, and headaches. Pt does report fevers, took Motrin with relief. Pt currently rates her overall pain a 9/10. Known exposure to Flu. Pt is currently pregnant.

## 2023-12-29 ENCOUNTER — Emergency Department
Admission: EM | Admit: 2023-12-29 | Discharge: 2023-12-29 | Discharge status: Against Medical Advice | Payer: Medicaid Other | Attending: Emergency Medicine | Admitting: Emergency Medicine

## 2023-12-29 ENCOUNTER — Other Ambulatory Visit: Payer: Self-pay

## 2023-12-29 DIAGNOSIS — Z5321 Procedure and treatment not carried out due to patient leaving prior to being seen by health care provider: Secondary | ICD-10-CM | POA: Diagnosis not present

## 2023-12-29 DIAGNOSIS — Z3A09 9 weeks gestation of pregnancy: Secondary | ICD-10-CM | POA: Diagnosis not present

## 2023-12-29 DIAGNOSIS — O219 Vomiting of pregnancy, unspecified: Secondary | ICD-10-CM | POA: Insufficient documentation

## 2023-12-29 MED ORDER — ONDANSETRON 4 MG PO TBDP
4.0000 mg | ORAL_TABLET | Freq: Once | ORAL | Status: AC
Start: 2023-12-29 — End: 2023-12-29
  Administered 2023-12-29: 4 mg via ORAL

## 2023-12-29 NOTE — ED Triage Notes (Addendum)
Pt reports n/v pt was seen at [facility] for same. Pt is aprox [redacted] weeks pregnant. Pt states she was given an rx for nausea meds but hasn't been able to pick it up yet.

## 2024-01-10 ENCOUNTER — Other Ambulatory Visit (INDEPENDENT_AMBULATORY_CARE_PROVIDER_SITE_OTHER): Payer: Self-pay

## 2024-01-10 ENCOUNTER — Other Ambulatory Visit (HOSPITAL_COMMUNITY)
Admission: RE | Admit: 2024-01-10 | Discharge: 2024-01-10 | Disposition: A | Discharge status: Home / Self Care | Payer: Medicaid Other | Source: Ambulatory Visit | Attending: Obstetrics & Gynecology | Admitting: Obstetrics & Gynecology

## 2024-01-10 ENCOUNTER — Encounter: Payer: Self-pay | Admitting: Obstetrics & Gynecology

## 2024-01-10 ENCOUNTER — Ambulatory Visit (INDEPENDENT_AMBULATORY_CARE_PROVIDER_SITE_OTHER): Payer: Medicaid Other | Admitting: Obstetrics & Gynecology

## 2024-01-10 VITALS — BP 120/73 | HR 86 | Wt 144.8 lb

## 2024-01-10 DIAGNOSIS — O219 Vomiting of pregnancy, unspecified: Secondary | ICD-10-CM | POA: Diagnosis not present

## 2024-01-10 DIAGNOSIS — O09529 Supervision of elderly multigravida, unspecified trimester: Secondary | ICD-10-CM | POA: Insufficient documentation

## 2024-01-10 DIAGNOSIS — Z3481 Encounter for supervision of other normal pregnancy, first trimester: Secondary | ICD-10-CM

## 2024-01-10 DIAGNOSIS — O09521 Supervision of elderly multigravida, first trimester: Secondary | ICD-10-CM | POA: Diagnosis not present

## 2024-01-10 DIAGNOSIS — O3680X0 Pregnancy with inconclusive fetal viability, not applicable or unspecified: Secondary | ICD-10-CM

## 2024-01-10 DIAGNOSIS — Z3401 Encounter for supervision of normal first pregnancy, first trimester: Secondary | ICD-10-CM | POA: Diagnosis present

## 2024-01-10 DIAGNOSIS — Z1339 Encounter for screening examination for other mental health and behavioral disorders: Secondary | ICD-10-CM

## 2024-01-10 DIAGNOSIS — Z3A08 8 weeks gestation of pregnancy: Secondary | ICD-10-CM

## 2024-01-10 DIAGNOSIS — Z3480 Encounter for supervision of other normal pregnancy, unspecified trimester: Secondary | ICD-10-CM | POA: Insufficient documentation

## 2024-01-10 MED ORDER — PRENATAL 28-0.8 MG PO TABS: 1.0000 | ORAL_TABLET | Freq: Every day | ORAL | 12 refills | Status: DC

## 2024-01-10 MED ORDER — ONDANSETRON 4 MG PO TBDP: 4.0000 mg | ORAL_TABLET | Freq: Four times a day (QID) | ORAL | 0 refills | Status: DC | PRN

## 2024-01-10 MED ORDER — ASPIRIN 81 MG PO TBEC: 81.0000 mg | DELAYED_RELEASE_TABLET | Freq: Every day | ORAL | 2 refills | Status: DC

## 2024-01-10 MED ORDER — PROMETHAZINE HCL 25 MG PO TABS: 25.0000 mg | ORAL_TABLET | Freq: Four times a day (QID) | ORAL | 2 refills | Status: DC | PRN

## 2024-01-10 NOTE — Progress Notes (Signed)

## 2024-01-10 NOTE — Progress Notes (Addendum)
 History:   Megan Nolan is a 38 y.o. [phone] at [redacted]w[redacted]d by early ultrasound today being seen today for her first obstetrical visit.  Her obstetrical history is significant for  one term SVD 17 years ago, no complications.  Also had an ectopic pregnancy s/p salpingectomy . Patient does intend to breast feed. Pregnancy history fully reviewed.  Patient reports nausea, vomiting, and breast tenderness .  Here with FOB.     HISTORY: OB History  Gravida Para Term Preterm AB Living  4 1 1  0 2 1  SAB IAB Ectopic Multiple Live Births  0 1 1 0 1    # Outcome Date GA Lbr Len/2nd Weight Sex Type Anes PTL Lv  4 Current           3 Ectopic 10/2022          2 Term 06/03/07 [redacted]w[redacted]d  6 lb 15 oz (3.147 kg) F Vag-Spont EPI  LIV     Birth Comments: No complication  1 IAB           Last pap smear was done 03/17/2023 and was normal  Past Medical History:  Diagnosis Date   Abnormal Pap smear    Past Surgical History:  Procedure Laterality Date   CERVIX LESION DESTRUCTION     DIAGNOSTIC LAPAROSCOPY WITH REMOVAL OF ECTOPIC PREGNANCY N/A 11/08/2022   Procedure: DIAGNOSTIC LAPAROSCOPY WITH REMOVAL OF ECTOPIC PREGNANCY;  Surgeon: Lazaro Arms, MD;  Location: [facility] OR;  Service: Gynecology;  Laterality: N/A;   WISDOM TOOTH EXTRACTION     Family History  Problem Relation Age of Onset   Cancer Maternal Grandmother    Cancer Paternal Grandmother    Social History   Tobacco Use   Smoking status: Former    Current packs/day: 0.00    Types: Cigarettes    Quit date: 04/01/2012    Years since quitting: 11.7   Smokeless tobacco: Never  Vaping Use   Vaping status: Never Used  Substance Use Topics   Alcohol use: Not Currently    Comment: occasionally   Drug use: Not Currently    Types: Marijuana   No Known Allergies Current Outpatient Medications on File Prior to Visit  Medication Sig Dispense Refill   Doxylamine-Pyridoxine 10-10 MG TBEC Take 2 tabs at bedtime. If needed, add another tab in the  morning. If needed, add another tab in the afternoon, up to 4 tabs/day. 120 tablet 0   ondansetron (ZOFRAN) 4 MG tablet Take 1 tablet (4 mg total) by mouth every 8 (eight) hours as needed for nausea or vomiting. 20 tablet 0   No current facility-administered medications on file prior to visit.    Review of Systems Pertinent items noted in HPI and remainder of comprehensive ROS otherwise negative.  Indications for ASA therapy  Two or more of the following: (Can do 81 mg daily) Age >=35 years Yes Sociodemographic characteristics (African American race, low socioeconomic level) Yes Personal risk factors (eg, previous pregnancy with low birth weight or small for gestational age infant, previous adverse pregnancy outcome [eg, stillbirth], interval >10 years between pregnancies) Yes  Physical Exam:   Vitals:   01/10/24 1523  BP: 120/73  Pulse: 86  Weight: 144 lb 12.8 oz (65.7 kg)   Fetal Heart Rate (bpm): 164   General: well-developed, well-nourished female in no acute distress  Breasts:  normal appearance, no masses or tenderness bilaterally, exam done in the presence of a chaperone.   Skin: normal coloration and turgor,  no rashes  Neurologic: oriented, normal, negative, normal mood  Extremities: normal strength, tone, and muscle mass, ROM of all joints is normal  HEENT PERRLA, extraocular movement intact and sclera clear, anicteric  Neck supple and no masses  Cardiovascular: regular rate and rhythm  Respiratory:  no respiratory distress, normal breath sounds  Abdomen: soft, non-tender; bowel sounds normal; no masses,  no organomegaly  Pelvic: deferred.     Assessment:    Pregnancy: [id number] Patient Active Problem List   Diagnosis Date Noted   Nausea/vomiting in pregnancy 01/10/2024   Encounter for supervision of normal first pregnancy in first trimester 01/10/2024   AMA (advanced maternal age) multigravida 35+ 01/10/2024     Plan:    1. Multigravida of advanced maternal age  in first trimester Patient interested in NIPS, she will return for lab appointment in 2 weeks. Aspirin to be started after 12 weeks for preeclampsia prophylaxis. Detailed anatomy scan ordered.  - aspirin EC 81 MG tablet; Take 1 tablet (81 mg total) by mouth at bedtime. Start taking when you are [redacted] weeks pregnant for rest of pregnancy for prevention of preeclampsia  Dispense: 300 tablet; Refill: 2 - PANORAMA PRENATAL TEST; Future - HORIZON CUSTOM; Future - [place] MFM OB DETAIL +14 WK; Future  2. Nausea/vomiting in pregnancy Antiemetics ordered - ondansetron (ZOFRAN-ODT) 4 MG disintegrating tablet; Take 1 tablet (4 mg total) by mouth every 6 (six) hours as needed for nausea.  Dispense: 20 tablet; Refill: 0 - promethazine (PHENERGAN) 25 MG tablet; Take 1 tablet (25 mg total) by mouth every 6 (six) hours as needed for nausea or vomiting.  Dispense: 30 tablet; Refill: 2  3. Encounter to determine fetal viability of pregnancy, single or unspecified fetus Viable pregnancy seen on ultrasound today. - US OB Limited; Future  4. [redacted] weeks gestation of pregnancy 5. Encounter for supervision of normal pregnancy in first trimester (Primary) - Cervicovaginal ancillary only - Culture, OB Urine - [place] MFM OB DETAIL +14 WK; Future - aspirin EC 81 MG tablet; Take 1 tablet (81 mg total) by mouth at bedtime. Start taking when you are [redacted] weeks pregnant for rest of pregnancy for prevention of preeclampsia  Dispense: 300 tablet; Refill: 2 - CBC/D/Plt+RPR+Rh+ABO+RubIgG...; Future - Hemoglobin A1c; Future - Comprehensive metabolic panel; Future - PANORAMA PRENATAL TEST; Future - HORIZON CUSTOM; Future - TSH Rfx on Abnormal to Free T4; Future - Anemia Profile B; Future  Initial labs to be drawn in two weeks. Continue prenatal vitamins. Problem list reviewed and updated. Anticipatory guidance about prenatal visits given including labs, ultrasounds, and testing. Weight gain recommendations per IOM guidelines  reviewed: underweight/BMI 18.5 or less > 28 - 40 lbs; normal weight/BMI 18.5 - 24.9 > 25 - 35 lbs; overweight/BMI 25 - 29.9 > 15 - 25 lbs; obese/BMI 30 or more > 11 - 20 lbs. Discussed usage of the Babyscripts app for more information about pregnancy, and to track blood pressures. Also discussed usage of virtual visits as additional source of managing and completing prenatal visits.  Patient was encouraged to use MyChart to review results, send requests, and have questions addressed.   The nature of [facility] with multiple MDs and Advanced Practice Providers was explained to patient; also emphasized that residents, students are part of our team. Routine obstetric precautions reviewed. Encouraged to seek out care at our office or emergency room [facility]U preferred) for urgent and/or emergent concerns. Return in about 2 weeks (around 01/24/2024) for Lab visit  4  weeks from now: OFFICE OB VISIT.      Jaynie Collins, MD, FACOG Obstetrician & Gynecologist, [facility] for [employer], [facility]

## 2024-01-12 ENCOUNTER — Encounter: Payer: Self-pay | Admitting: Obstetrics & Gynecology

## 2024-01-12 LAB — URINE CULTURE, OB REFLEX

## 2024-01-12 LAB — CERVICOVAGINAL ANCILLARY ONLY
Chlamydia: NEGATIVE
Comment: NEGATIVE
Comment: NORMAL
Neisseria Gonorrhea: NEGATIVE

## 2024-01-12 LAB — CULTURE, OB URINE

## 2024-01-24 ENCOUNTER — Other Ambulatory Visit: Payer: Medicaid Other

## 2024-01-24 DIAGNOSIS — O09521 Supervision of elderly multigravida, first trimester: Secondary | ICD-10-CM

## 2024-01-24 DIAGNOSIS — Z3401 Encounter for supervision of normal first pregnancy, first trimester: Secondary | ICD-10-CM

## 2024-01-26 ENCOUNTER — Encounter: Payer: Self-pay | Admitting: Obstetrics & Gynecology

## 2024-01-26 LAB — CBC/D/PLT+RPR+RH+ABO+RUBIGG...
Antibody Screen: NEGATIVE
Basophils Absolute: 0 10*3/uL (ref 0.0–0.2)
Basos: 0 %
EOS (ABSOLUTE): 0 10*3/uL (ref 0.0–0.4)
Eos: 0 %
HCV Ab: NONREACTIVE
HIV Screen 4th Generation wRfx: NONREACTIVE
Hematocrit: 30.8 % — ABNORMAL LOW (ref 34.0–46.6)
Hemoglobin: 10.3 g/dL — ABNORMAL LOW (ref 11.1–15.9)
Hepatitis B Surface Ag: NEGATIVE
Immature Grans (Abs): 0 10*3/uL (ref 0.0–0.1)
Immature Granulocytes: 0 %
Lymphocytes Absolute: 2.4 10*3/uL (ref 0.7–3.1)
Lymphs: 25 %
MCH: 35.3 pg — ABNORMAL HIGH (ref 26.6–33.0)
MCHC: 33.4 g/dL (ref 31.5–35.7)
MCV: 106 fL — ABNORMAL HIGH (ref 79–97)
Monocytes Absolute: 0.5 10*3/uL (ref 0.1–0.9)
Monocytes: 5 %
Neutrophils Absolute: 6.4 10*3/uL (ref 1.4–7.0)
Neutrophils: 70 %
Platelets: 253 10*3/uL (ref 150–450)
RBC: 2.92 x10E6/uL — ABNORMAL LOW (ref 3.77–5.28)
RDW: 11.4 % — ABNORMAL LOW (ref 11.7–15.4)
RPR Ser Ql: NONREACTIVE
Rh Factor: POSITIVE
Rubella Antibodies, IGG: 4.3 {index} (ref >0.99)
WBC: 9.3 10*3/uL (ref 3.4–10.8)

## 2024-01-26 LAB — COMPREHENSIVE METABOLIC PANEL
ALT: 10 IU/L (ref 0–32)
AST: 14 IU/L (ref 0–40)
Albumin: 4.4 g/dL (ref 3.9–4.9)
Alkaline Phosphatase: 41 IU/L — ABNORMAL LOW (ref 44–121)
BUN/Creatinine Ratio: 15 (ref 9–23)
BUN: 8 mg/dL (ref 6–20)
Bilirubin Total: 0.2 mg/dL (ref 0.0–1.2)
CO2: 20 mmol/L (ref 20–29)
Calcium: 9.6 mg/dL (ref 8.7–10.2)
Chloride: 104 mmol/L (ref 96–106)
Creatinine, Ser: 0.53 mg/dL — ABNORMAL LOW (ref 0.57–1.00)
Globulin, Total: 2.5 g/dL (ref 1.5–4.5)
Glucose: 83 mg/dL (ref 70–99)
Potassium: 3.9 mmol/L (ref 3.5–5.2)
Sodium: 136 mmol/L (ref 134–144)
Total Protein: 6.9 g/dL (ref 6.0–8.5)
eGFR: 123 mL/min/{1.73_m2} (ref >59)

## 2024-01-26 LAB — ANEMIA PROFILE B
Ferritin: 335 ng/mL — ABNORMAL HIGH (ref 15–150)
Folate: >20 ng/mL (ref >3.0)
Iron Saturation: 28 % (ref 15–55)
Iron: 82 ug/dL (ref 27–159)
Retic Ct Pct: 3.2 % — ABNORMAL HIGH (ref 0.6–2.6)
Total Iron Binding Capacity: 288 ug/dL (ref 250–450)
UIBC: 206 ug/dL (ref 131–425)
Vitamin B-12: 942 pg/mL (ref 232–1245)

## 2024-01-26 LAB — HEMOGLOBIN A1C
Est. average glucose Bld gHb Est-mCnc: 77 mg/dL
Hgb A1c MFr Bld: 4.3 % — ABNORMAL LOW (ref 4.8–5.6)

## 2024-01-26 LAB — HCV INTERPRETATION

## 2024-01-26 LAB — TSH RFX ON ABNORMAL TO FREE T4: TSH: 0.641 u[IU]/mL (ref 0.450–4.500)

## 2024-01-30 LAB — PANORAMA PRENATAL TEST FULL PANEL:PANORAMA TEST PLUS 5 ADDITIONAL MICRODELETIONS: FETAL FRACTION: 9.9

## 2024-01-31 ENCOUNTER — Encounter: Payer: Self-pay | Admitting: Obstetrics & Gynecology

## 2024-02-04 ENCOUNTER — Encounter: Payer: Self-pay | Admitting: Obstetrics & Gynecology

## 2024-02-04 LAB — HORIZON CUSTOM: REPORT SUMMARY: NEGATIVE

## 2024-02-07 ENCOUNTER — Ambulatory Visit: Payer: Self-pay | Admitting: Obstetrics and Gynecology

## 2024-02-07 ENCOUNTER — Encounter: Payer: Self-pay | Admitting: Obstetrics and Gynecology

## 2024-02-07 VITALS — BP 116/71 | HR 86 | Wt 143.6 lb

## 2024-02-07 DIAGNOSIS — Z3A12 12 weeks gestation of pregnancy: Secondary | ICD-10-CM

## 2024-02-07 DIAGNOSIS — O09521 Supervision of elderly multigravida, first trimester: Secondary | ICD-10-CM

## 2024-02-07 DIAGNOSIS — Z3480 Encounter for supervision of other normal pregnancy, unspecified trimester: Secondary | ICD-10-CM

## 2024-02-07 DIAGNOSIS — Z3481 Encounter for supervision of other normal pregnancy, first trimester: Secondary | ICD-10-CM

## 2024-02-07 DIAGNOSIS — O09522 Supervision of elderly multigravida, second trimester: Secondary | ICD-10-CM

## 2024-02-07 MED ORDER — ASPIRIN 81 MG PO TBEC: 81.0000 mg | DELAYED_RELEASE_TABLET | Freq: Every day | ORAL | 2 refills | Status: DC

## 2024-02-07 MED ORDER — PRENATAL 28-0.8 MG PO TABS: 1.0000 | ORAL_TABLET | Freq: Every day | ORAL | 12 refills | Status: DC

## 2024-02-07 MED ORDER — ENSURE HEALTHY MOM PO LIQD: 1.0000 | Freq: Three times a day (TID) | ORAL | 90 refills | Status: DC

## 2024-02-07 NOTE — Progress Notes (Signed)
 ROB: Not wanting to eat, hair has fallen out   Whole milk/ ensure Prescription for [facility] office   Weight   Hair skin and nail vitamins while pregnant? - Nature Gummies ?

## 2024-02-07 NOTE — Progress Notes (Signed)
   PRENATAL VISIT NOTE  Subjective:  Megan Nolan is a 38 y.o. [phone] at [redacted]w[redacted]d being seen today for ongoing prenatal care.  She is currently monitored for the following issues for this high-risk pregnancy and has Nausea/vomiting in pregnancy; Encounter for supervision of normal pregnancy in multigravida, antepartum; and AMA (advanced maternal age) multigravida 35+ on their problem list.  Patient reports nausea which is improving.  Contractions: Not present. Vag. Bleeding: None.   . Denies leaking of fluid.   The following portions of the patient's history were reviewed and updated as appropriate: allergies, current medications, past family history, past medical history, past social history, past surgical history and problem list.   Objective:   Vitals:   02/07/24 1546  BP: 116/71  Pulse: 86  Weight: 143 lb 9.6 oz (65.1 kg)    Fetal Status:           General:  Alert, oriented and cooperative. Patient is in no acute distress.  Skin: Skin is warm and dry. No rash noted.   Cardiovascular: Normal heart rate noted  Respiratory: Normal respiratory effort, no problems with respiration noted  Abdomen: Soft, gravid, appropriate for gestational age.  Pain/Pressure: Absent     Pelvic: Cervical exam performed in the presence of a chaperone        Extremities: Normal range of motion.  Edema: None  Mental Status: Normal mood and affect. Normal behavior. Normal judgment and thought content.   Assessment and Plan:  Pregnancy: G4P1021 at [redacted]w[redacted]d 1. Encounter for supervision of normal pregnancy in multigravida, antepartum (Primary) Patient is doing well without complaints Anatomy ultrasound scheduled in May  2. Multigravida of advanced maternal age in second trimester Start ASA  Preterm labor symptoms and general obstetric precautions including but not limited to vaginal bleeding, contractions, leaking of fluid and fetal movement were reviewed in detail with the patient. Please refer to  After Visit Summary for other counseling recommendations.   Return in about 4 weeks (around 03/06/2024) for in person, ROB, Low risk.  Future Appointments  Date Time Provider Department Center  03/06/2024  1:30 PM Tereso Newcomer, MD [facility]  03/21/2024  2:00 PM [facility] NURSE [facility]  03/21/2024  2:15 PM [facility] PROVIDER 1 [facility]  03/21/2024  2:30 PM [facility] US4 [facility]    Catalina Antigua, MD

## 2024-02-14 ENCOUNTER — Other Ambulatory Visit: Payer: Self-pay | Admitting: *Deleted

## 2024-02-14 NOTE — Progress Notes (Signed)
 erroneous

## 2024-03-06 ENCOUNTER — Encounter: Payer: Self-pay | Admitting: Obstetrics & Gynecology

## 2024-03-06 ENCOUNTER — Telehealth: Payer: Medicaid Other | Admitting: Obstetrics & Gynecology

## 2024-03-06 DIAGNOSIS — O09522 Supervision of elderly multigravida, second trimester: Secondary | ICD-10-CM

## 2024-03-06 DIAGNOSIS — R399 Unspecified symptoms and signs involving the genitourinary system: Secondary | ICD-10-CM

## 2024-03-06 DIAGNOSIS — Z3480 Encounter for supervision of other normal pregnancy, unspecified trimester: Secondary | ICD-10-CM

## 2024-03-06 DIAGNOSIS — Z3A16 16 weeks gestation of pregnancy: Secondary | ICD-10-CM

## 2024-03-06 MED ORDER — NUTRITIONAL SUPPLEMENT PO LIQD: ORAL | 12 refills | Status: DC

## 2024-03-06 NOTE — Progress Notes (Signed)
 OBSTETRICS PRENATAL VIRTUAL VISIT ENCOUNTER NOTE  Provider location: [facility]   Patient location: Home  I connected with Anthony Bateman on 03/06/24 at  1:30 PM EDT by MyChart Video Encounter and verified that I am speaking with the correct person using two identifiers. I discussed the limitations, risks, security and privacy concerns of performing an evaluation and management service virtually and the availability of in person appointments. I also discussed with the patient that there may be a patient responsible charge related to this service. The patient expressed understanding and agreed to proceed. Subjective:  Megan Nolan is a 38 y.o. [phone] at [redacted]w[redacted]d being seen today for ongoing prenatal care.  She is currently monitored for the following issues for this low-risk pregnancy and has Nausea/vomiting in pregnancy; Encounter for supervision of normal pregnancy in multigravida, antepartum; and AMA (advanced maternal age) multigravida 35+ on their problem list.  Patient reports  having some incontinence recently, no dysuria or back pain or increased urgency. When she wants to urinate, she can have slight urinary leakage prior to making it to the bathroom .  Also desires prescription for whole milk to submit to [facility], and pregnancy verification letter.  Contractions: Not present. Vag. Bleeding: None.  Movement: Present. Denies any leaking of fluid.   The following portions of the patient's history were reviewed and updated as appropriate: allergies, current medications, past family history, past medical history, past social history, past surgical history and problem list.   Objective:  There were no vitals filed for this visit.  Fetal Status:     Movement: Present     General:  Alert, oriented and cooperative. Patient is in no acute distress.  Respiratory: Normal respiratory effort, no problems with respiration noted  Mental Status: Normal mood and  affect. Normal behavior. Normal judgment and thought content.  Rest of physical exam deferred due to type of encounter   Assessment and Plan:  Pregnancy: G4P1021 at [redacted]w[redacted]d 1. Multigravida of advanced maternal age in second trimester 2. [redacted] weeks gestation of pregnancy 3. Encounter for supervision of normal pregnancy in multigravida, antepartum (Primary) Discussed that incontinence could be a sign of UTI, patient will come in for check tomorrow.  POCT Urinalysis and Urine culture orders placed. It could also be due to position of the gravid uterus at this gestation, could get better as the uterus grows bigger and out of the pelvis.  Will continue to monitior. Whole milk Rx written and printed, she will pick up tomorrow. Pregnancy verification letter sent to MyChart. - Nutritional Supplement LIQD; Please provide Whole Milk for nutritional supplementation during pregnancy. Take one cup (8 ounces) three times a day with meals.  Provide 4 gallons of milk per month for patient.  Dispense: 3785.41 mL; Refill: 12  Preterm labor symptoms and general obstetric precautions including but not limited to vaginal bleeding, contractions, leaking of fluid and fetal movement were reviewed in detail with the patient. I discussed the assessment and treatment plan with the patient. The patient was provided an opportunity to ask questions and all were answered. The patient agreed with the plan and demonstrated an understanding of the instructions. The patient was advised to call back or seek an in-person office evaluation/go to MAU at [facility] for any urgent or concerning symptoms. Please refer to After Visit Summary for other counseling recommendations.   I provided 15 minutes of face-to-face time during this encounter.  Return in about 4 weeks (around  04/03/2024) for OFFICE OB VISIT (MD or APP).  Future Appointments  Date Time Provider Department Center  03/21/2024  2:00 PM [facility] NURSE [facility]  [facility]  03/21/2024  2:15 PM [facility] PROVIDER 1 [facility]  03/21/2024  2:30 PM [facility] US4 [facility]    Lenoard Rad, MD Center for [employer], [facility]

## 2024-03-07 ENCOUNTER — Ambulatory Visit: Payer: Self-pay

## 2024-03-21 ENCOUNTER — Other Ambulatory Visit: Payer: Self-pay | Admitting: *Deleted

## 2024-03-21 ENCOUNTER — Ambulatory Visit: Payer: Self-pay | Attending: Obstetrics & Gynecology

## 2024-03-21 ENCOUNTER — Ambulatory Visit: Payer: Self-pay

## 2024-03-21 ENCOUNTER — Ambulatory Visit (HOSPITAL_BASED_OUTPATIENT_CLINIC_OR_DEPARTMENT_OTHER): Payer: Medicaid Other

## 2024-03-21 VITALS — BP 119/57 | HR 81

## 2024-03-21 DIAGNOSIS — O358XX0 Maternal care for other (suspected) fetal abnormality and damage, not applicable or unspecified: Secondary | ICD-10-CM | POA: Insufficient documentation

## 2024-03-21 DIAGNOSIS — Z363 Encounter for antenatal screening for malformations: Secondary | ICD-10-CM | POA: Insufficient documentation

## 2024-03-21 DIAGNOSIS — Z3A19 19 weeks gestation of pregnancy: Secondary | ICD-10-CM

## 2024-03-21 DIAGNOSIS — O35BXX0 Maternal care for other (suspected) fetal abnormality and damage, fetal cardiac anomalies, not applicable or unspecified: Secondary | ICD-10-CM

## 2024-03-21 DIAGNOSIS — O3429 Maternal care due to uterine scar from other previous surgery: Secondary | ICD-10-CM

## 2024-03-21 DIAGNOSIS — O09522 Supervision of elderly multigravida, second trimester: Secondary | ICD-10-CM

## 2024-03-21 DIAGNOSIS — Z362 Encounter for other antenatal screening follow-up: Secondary | ICD-10-CM

## 2024-03-21 DIAGNOSIS — Z3481 Encounter for supervision of other normal pregnancy, first trimester: Secondary | ICD-10-CM | POA: Insufficient documentation

## 2024-03-21 DIAGNOSIS — O09521 Supervision of elderly multigravida, first trimester: Secondary | ICD-10-CM | POA: Insufficient documentation

## 2024-03-21 NOTE — Progress Notes (Signed)
 MFM Consult Note  Megan Nolan is currently at 19 weeks and 0 days.  She was seen due to advanced maternal age (38 years old).  She denies any significant past medical history and denies any problems in her current pregnancy.    She had a cell free DNA test earlier in her pregnancy which indicated a low risk for trisomy 86, 17, and 13. A female fetus is predicted.   She was informed that the fetal growth and amniotic fluid level were appropriate for her gestational age.   On today's exam, an intracardiac echogenic focus was noted in the left ventricle of the fetal heart.    The small association between an echogenic focus and Down syndrome was discussed.   Due to the echogenic focus noted today, the patient was offered and declined an amniocentesis today for definitive diagnosis of fetal aneuploidy.  She reports that she is comfortable with her negative cell free DNA test.  The views of the fetal heart were limited today due to the fetal position.  The patient was informed that anomalies may be missed due to technical limitations. If the fetus is in a suboptimal position or maternal habitus is increased, visualization of the fetus in the maternal uterus may be impaired.  A follow-up exam was scheduled in 5 weeks to complete the views of the fetal anatomy which were limited today due to the fetal position.    The patient stated that all of her questions were answered today.  A total of 30 minutes was spent counseling and coordinating the care for this patient.  Greater than 50% of the time was spent in direct face-to-face contact.

## 2024-03-22 ENCOUNTER — Encounter: Payer: Self-pay | Admitting: Obstetrics & Gynecology

## 2024-03-22 DIAGNOSIS — O358XX0 Maternal care for other (suspected) fetal abnormality and damage, not applicable or unspecified: Secondary | ICD-10-CM | POA: Insufficient documentation

## 2024-03-26 ENCOUNTER — Encounter: Payer: Self-pay | Admitting: Obstetrics & Gynecology

## 2024-03-27 ENCOUNTER — Encounter: Payer: Self-pay | Admitting: *Deleted

## 2024-03-27 ENCOUNTER — Other Ambulatory Visit: Payer: Self-pay

## 2024-03-27 DIAGNOSIS — Z3480 Encounter for supervision of other normal pregnancy, unspecified trimester: Secondary | ICD-10-CM

## 2024-03-27 MED ORDER — NUTRITIONAL SUPPLEMENT PO LIQD: ORAL | 12 refills | Status: DC

## 2024-03-27 NOTE — Progress Notes (Signed)
 Rx reprinted for Milk and stamped Given to case manager.

## 2024-03-28 ENCOUNTER — Other Ambulatory Visit: Payer: Self-pay

## 2024-03-28 DIAGNOSIS — Z3480 Encounter for supervision of other normal pregnancy, unspecified trimester: Secondary | ICD-10-CM

## 2024-03-28 DIAGNOSIS — O219 Vomiting of pregnancy, unspecified: Secondary | ICD-10-CM

## 2024-03-28 MED ORDER — NUTRITIONAL SUPPLEMENT PO LIQD: ORAL | 12 refills | Status: DC

## 2024-03-28 NOTE — Progress Notes (Signed)
 Rx reprinted Due to needing Dx for milk.

## 2024-04-03 ENCOUNTER — Encounter: Payer: Self-pay | Admitting: Obstetrics & Gynecology

## 2024-04-03 ENCOUNTER — Telehealth (INDEPENDENT_AMBULATORY_CARE_PROVIDER_SITE_OTHER): Payer: Self-pay | Admitting: Obstetrics & Gynecology

## 2024-04-03 DIAGNOSIS — Z3480 Encounter for supervision of other normal pregnancy, unspecified trimester: Secondary | ICD-10-CM

## 2024-04-03 DIAGNOSIS — O358XX0 Maternal care for other (suspected) fetal abnormality and damage, not applicable or unspecified: Secondary | ICD-10-CM

## 2024-04-03 DIAGNOSIS — O09522 Supervision of elderly multigravida, second trimester: Secondary | ICD-10-CM

## 2024-04-03 DIAGNOSIS — Z3A20 20 weeks gestation of pregnancy: Secondary | ICD-10-CM

## 2024-04-03 NOTE — Progress Notes (Signed)
 OBSTETRICS PRENATAL VIRTUAL VISIT ENCOUNTER NOTE  Provider location: Center for [facility]   Patient location: Work  I connected with Megan Nolan on 04/03/24 at  2:30 PM EDT by MyChart Video Encounter and verified that I am speaking with the correct person using two identifiers. I discussed the limitations, risks, security and privacy concerns of performing an evaluation and management service virtually and the availability of in person appointments. I also discussed with the patient that there may be a patient responsible charge related to this service. The patient expressed understanding and agreed to proceed. Subjective:  Megan Nolan is a 38 y.o. [phone] at [redacted]w[redacted]d being seen today for ongoing prenatal care.  She is currently monitored for the following issues for this low-risk pregnancy and has Nausea/vomiting in pregnancy; Encounter for supervision of normal pregnancy in multigravida, antepartum; AMA (advanced maternal age) multigravida 35+; and Fetal echogenic intracardiac focus of the heart (EIF) on anatomy scan on their problem list.  Patient reports no complaints.  Contractions: Not present. Vag. Bleeding: None.  Movement: Present. Denies any leaking of fluid.  Worried about increased hyperpigmentation in lower part of face.  The following portions of the patient's history were reviewed and updated as appropriate: allergies, current medications, past family history, past medical history, past social history, past surgical history and problem list.   Objective:  There were no vitals filed for this visit.  Fetal Status:      Movement: Present    General: Alert, oriented and cooperative. Patient is in no acute distress.  Increased hyperpigmentation on lower face.  Respiratory: Normal respiratory effort, no problems with respiration noted  Mental Status: Normal mood and affect. Normal behavior. Normal judgment and thought content.  Rest of physical  exam deferred due to type of encounter  Imaging: US  MFM OB DETAIL +14 WK Result Date: 03/21/2024 ----------------------------------------------------------------------  OBSTETRICS REPORT                       (Signed Final 03/21/2024 04:42 pm) ---------------------------------------------------------------------- Patient Info  ID #:       [id number]                          D.O.B.:  02/08/1986 (38 yrs)(F)  Name:       Megan Nolan              Visit Date: 03/21/2024 02:11 pm ---------------------------------------------------------------------- Performed By  Attending:        Sal Crass MD         Ref. Address:     [address]                                                             [place], [place]                                                             [address]  Performed By:     Elspeth Hals       [facility]  Referred By:      Julianne Octave MD ---------------------------------------------------------------------- Orders  #  Description                           Code        Ordered By  1  US  MFM OB DETAIL +14 WK               16109.60    Lenoard Rad ----------------------------------------------------------------------  #  Order #                     Accession #                Episode #  1  [id number]                   [id number]                 [id number] ---------------------------------------------------------------------- Indications  Advanced maternal age multigravida 33+,        O53.522  second trimester (38 yrs)  Fetal or maternal indication (hx RT            O35.8XX0  salpingectomy, partial oophorectomy 2023)  Echogenic intracardiac focus of the heart      O35.8XX0  (EIF)  Encounter for antenatal screening for          Z36.3   malformations  [redacted] weeks gestation of pregnancy                Z3A.19  LR NIPS - Female, Negative Horizon ---------------------------------------------------------------------- Vital Signs  BP:          119/57 ---------------------------------------------------------------------- Fetal Evaluation  Num Of Fetuses:         1  Fetal Heart Rate(bpm):  150  Cardiac Activity:       Observed  Presentation:           Breech  Placenta:               Anterior  P. Cord Insertion:      Visualized, central  Amniotic Fluid  AFI FV:      Within normal limits  Largest Pocket(cm)                              6.61 ---------------------------------------------------------------------- Biometry  BPD:      44.5  mm     G. Age:  19w 3d         70  %    CI:        73.59   %    70 - 86                                                          FL/HC:      17.8   %    16.1 - 18.3  HC:      164.8  mm     G. Age:  19w 1d         53  %    HC/AC:      1.13        1.09 - 1.39  AC:      146.4  mm     G. Age:  20w 0d         76  %    FL/BPD:     66.1   %  FL:       29.4  mm     G. Age:  19w 0d         45  %    FL/AC:      20.1   %    20 - 24  HUM:      29.4  mm     G. Age:  19w 4d         66  %  CER:      19.6  mm     G. Age:  19w 0d         36  %  NFT:       3.7  mm  LV:        5.5  mm  CM:        4.8  mm  Est. FW:     296  gm    0 lb 10 oz      75  % ---------------------------------------------------------------------- OB History  Gravidity:    4         Term:   1        Prem:   0        SAB:   0  TOP:          1       Ectopic:  1        Living: 1 ---------------------------------------------------------------------- Gestational Age  U/S Today:     19w 3d                                        EDD:   08/12/24  Best:          19w 0d     Det. By:  U/S C R L  (01/10/24)    EDD:   08/15/24 ---------------------------------------------------------------------- Targeted Anatomy  Central Nervous System  Calvarium/Cranial V.:   Appears normal  Cereb./Vermis:          Appears normal  Cavum:                 Appears normal         Cisterna Magna:         Appears normal  Lateral Ventricles:    Appears normal         Midline Falx:           Appears normal  Choroid Plexus:        Appears normal  Spine  Cervical:              Appears normal         Sacral:                 Appears normal  Thoracic:              Limited                Shape/Curvature:        Appears normal  Lumbar:                Limited  Head/Neck  Lips:                  Appears normal         Profile:                Appears normal  Neck:                  Appears normal         Orbits/Eyes:            Appears normal  Nuchal Fold:           Appears normal         Mandible:               Appears normal  Nasal Bone:            Present                Maxilla:                Appears normal  Thorax  4 Chamber View:        Appears normal; EIF    Interventr. Septum:     Appears normal  Cardiac Rhythm:        Normal                 Cardiac Axis:           Normal  Cardiac Situs:         Appears normal         Diaphragm:              Appears normal  Rt Outflow Tract:      Not well visualized    3 Vessel View:          Appears normal  Lt Outflow Tract:      Not well visualized    3 V Trachea View:       Appears normal  Aortic Arch:           Appears normal         IVC:                    Not well visualized  Ductal Arch:           Appears normal  Crossing:               Appears normal  SVC:                   Not well visualized  Abdomen  Ventral Wall:          Appears normal         Lt Kidney:              Appears normal  Cord Insertion:        Appears normal         Rt Kidney:              Appears normal  Situs:                 Appears normal         Bladder:                Appears normal  Stomach:               Appears normal  Extremities  Lt Humerus:            Appears normal         Lt Femur:               Appears normal  Rt Humerus:            Appears normal         Rt  Femur:               Appears normal  Lt Forearm:            Appears normal         Lt Lower Leg:           Appears normal  Rt Forearm:            Appears normal         Rt Lower Leg:           Appears normal  Lt Hand:               Open hand nml          Lt Foot:                Nml heel/foot  Rt Hand:               Open hand nml          Rt Foot:                Nml heel/foot  Other  Umbilical Cord:        Normal 3-vessel        Genitalia:              Female-nml  Comment:     Technically difficult due to fetal position. ---------------------------------------------------------------------- Cervix Uterus Adnexa  Cervix  Length:            3.4  cm.  Normal appearance by transabdominal scan  Uterus  No abnormality visualized.  Right Ovary  Not visualized.  Left Ovary  Not visualized.  Cul De Sac  No free fluid seen.  Adnexa  No adnexal mass visualized ---------------------------------------------------------------------- Comments  Megan Nolan is currently at 19 weeks and 0 days.  She  was seen due to advanced maternal age (38 years old).  She denies any significant past medical history and denies  any problems in her current pregnancy.  She had a  cell free DNA test earlier in her pregnancy which  indicated a low risk for trisomy 21, 18, and 13. A female fetus is  predicted.  She was informed that the fetal growth and amniotic fluid  level were appropriate for her gestational age.  On today's exam, an intracardiac echogenic focus was noted  in the left ventricle of the fetal heart.  The small association between an echogenic focus and  Down syndrome was discussed.  Due to the echogenic focus noted today, the patient was  offered and declined an amniocentesis today for definitive  diagnosis of fetal aneuploidy.  She reports that she is  comfortable with her negative cell free DNA test.  The views of the fetal heart were limited today due to the fetal  position.  The patient was informed that anomalies may be missed due   to technical limitations. If the fetus is in a suboptimal position  or maternal habitus is increased, visualization of the fetus in  the maternal uterus may be impaired.  A follow-up exam was scheduled in 5 weeks to complete the  views of the fetal anatomy which were limited today due to  the fetal position.  The patient stated that all of her questions were answered  today.  A total of 30 minutes was spent counseling and coordinating  the care for this patient.  Greater than 50% of the time was  spent in direct face-to-face contact. ----------------------------------------------------------------------                  Sal Crass, MD Electronically Signed Final Report   03/21/2024 04:42 pm ----------------------------------------------------------------------    Assessment and Plan:  Pregnancy: [id number] at [redacted]w[redacted]d 1. Fetal echogenic intracardiac focus of the heart (EIF) on anatomy scan Reassured about isolated EIF, LR NIPS  2. Multigravida of advanced maternal age in second trimester LR NIPS  3. [redacted] weeks gestation of pregnancy 4. Encounter for supervision of normal pregnancy in multigravida, antepartum (Primary) Incomplete anatomy scan, rescan ordered on 04/25/24 Reassured  that hyperpigmentation can occur in pregnancy, it can resolve afterwards.  Do not recommend skin lightening creams now, she can be seen by Dermatology if this persists after pregnancy for therapy recommendations.                 . Preterm labor symptoms and general obstetric precautions including but not limited to vaginal bleeding, contractions, leaking of fluid and fetal movement were reviewed in detail with the patient. I discussed the assessment and treatment plan with the patient. The patient was provided an opportunity to ask questions and all were answered. The patient agreed with the plan and demonstrated an understanding of the instructions. The patient was advised to call back or seek an in-person office evaluation/go to MAU  at [facility] for any urgent or concerning symptoms. Please refer to After Visit Summary for other counseling recommendations.   I provided 8 minutes of face-to-face time during this encounter.  Return in about 4 weeks (around 05/01/2024) for OFFICE OB VISIT (MD or APP).  Future Appointments  Date Time Provider Department Center  04/25/2024  3:00 PM [facility] PROVIDER 1 [facility]  04/25/2024  3:30 PM [facility] US4 [facility]  05/01/2024  2:30 PM Izell Marsh, MD [facility]    Lenoard Rad, MD Center for [facility], [facility]

## 2024-04-25 ENCOUNTER — Ambulatory Visit: Payer: Self-pay

## 2024-04-25 ENCOUNTER — Ambulatory Visit: Payer: Self-pay | Attending: Obstetrics and Gynecology

## 2024-04-25 DIAGNOSIS — O09522 Supervision of elderly multigravida, second trimester: Secondary | ICD-10-CM

## 2024-04-25 DIAGNOSIS — Z3A24 24 weeks gestation of pregnancy: Secondary | ICD-10-CM

## 2024-04-25 DIAGNOSIS — O358XX0 Maternal care for other (suspected) fetal abnormality and damage, not applicable or unspecified: Secondary | ICD-10-CM

## 2024-04-25 DIAGNOSIS — Z362 Encounter for other antenatal screening follow-up: Secondary | ICD-10-CM | POA: Insufficient documentation

## 2024-04-25 NOTE — Progress Notes (Signed)
 MFM Consult Note  Megan Nolan is currently at 24 weeks and 0 days.  She has been followed due to advanced maternal age (38 years old).    The views of the fetal heart were unable to be fully visualized during her last exam.  She denies any problems since her last exam.  She denies any significant past medical history and denies any problems in her current pregnancy.    On today's exam, the overall EFW of 1 pound 7 ounces measures at the 36th percentile for her gestational age.    There was normal amniotic fluid noted.  The views of the fetal heart were visualized today.  There were no obvious fetal cardiac defects, other than the previously noted echogenic focus in the left ventricle of the fetal heart.    The limitations of ultrasound in the detection of all anomalies was discussed.    As the fetal growth is within normal limits, no further exams were scheduled in our office.  The patient stated that all of her questions were answered today.  A total of 20 minutes was spent counseling and coordinating the care for this patient.  Greater than 50% of the time was spent in direct face-to-face contact.

## 2024-04-26 ENCOUNTER — Encounter (HOSPITAL_COMMUNITY): Payer: Self-pay

## 2024-04-26 ENCOUNTER — Ambulatory Visit (HOSPITAL_COMMUNITY)
Admission: EM | Admit: 2024-04-26 | Discharge: 2024-04-26 | Disposition: A | Discharge status: Home / Self Care | Payer: Self-pay | Attending: Family Medicine | Admitting: Family Medicine

## 2024-04-26 DIAGNOSIS — W57XXXA Bitten or stung by nonvenomous insect and other nonvenomous arthropods, initial encounter: Secondary | ICD-10-CM

## 2024-04-26 DIAGNOSIS — L03211 Cellulitis of face: Secondary | ICD-10-CM

## 2024-04-26 MED ORDER — PREDNISONE 20 MG PO TABS: 40.0000 mg | ORAL_TABLET | Freq: Every day | ORAL | 0 refills | Status: AC

## 2024-04-26 MED ORDER — CEPHALEXIN 500 MG PO CAPS
500.0000 mg | ORAL_CAPSULE | Freq: Two times a day (BID) | ORAL | 0 refills | Status: AC
Start: 2024-04-26 — End: 2024-05-01

## 2024-04-26 NOTE — ED Provider Notes (Signed)
 [facility]    CSN: [id number] Arrival date & time: 04/26/24  4098      History   Chief Complaint Chief Complaint  Patient presents with   Eye Problem   Insect Bite    HPI Megan Nolan is a 38 y.o. female.    Eye Problem Here for swelling and itching over left lower eyelid.  Yesterday when she awoke from a nap she noted some discomfort and stinging in her left lower eyelid.  She was pretty sure that something had maybe bitten her, like a spider or an ant.  This morning it is more swollen and is also more sore.  It is still itching.  No fever or chills  Last menstrual cycle was about about 6 months ago, she is [redacted] weeks pregnant.  NKDA    Past Medical History:  Diagnosis Date   Abnormal Pap smear     Patient Active Problem List   Diagnosis Date Noted   Fetal echogenic intracardiac focus of the heart (EIF) on anatomy scan 03/22/2024   Nausea/vomiting in pregnancy 01/10/2024   Encounter for supervision of normal pregnancy in multigravida, antepartum 01/10/2024   AMA (advanced maternal age) multigravida 35+ 01/10/2024    Past Surgical History:  Procedure Laterality Date   CERVIX LESION DESTRUCTION     DIAGNOSTIC LAPAROSCOPY WITH REMOVAL OF ECTOPIC PREGNANCY N/A 11/08/2022   Procedure: DIAGNOSTIC LAPAROSCOPY WITH REMOVAL OF ECTOPIC PREGNANCY;  Surgeon: Wendelyn Halter, MD;  Location: [facility];  Service: Gynecology;  Laterality: N/A;   WISDOM TOOTH EXTRACTION      OB History     Gravida  4   Para  1   Term  1   Preterm      AB  2   Living  1      SAB  0   IAB  1   Ectopic  1   Multiple      Live Births  1            Home Medications    Prior to Admission medications   Medication Sig Start Date End Date Taking? Authorizing Provider  aspirin  EC 81 MG tablet Take 1 tablet (81 mg total) by mouth at bedtime. Start taking when you are [redacted] weeks pregnant for rest of pregnancy for prevention of preeclampsia 02/07/24  Yes Constant,  Peggy, MD  cephALEXin (KEFLEX) 500 MG capsule Take 1 capsule (500 mg total) by mouth 2 (two) times daily for 5 days. 04/26/24 05/01/24 Yes Tavian Callander K, MD  predniSONE (DELTASONE) 20 MG tablet Take 2 tablets (40 mg total) by mouth daily with breakfast for 3 days. 04/26/24 04/29/24 Yes Ann Keto, MD  Prenatal 28-0.8 MG TABS Take 1 tablet by mouth daily. 02/07/24  Yes Constant, Carles Cheadle, MD    Family History Family History  Problem Relation Age of Onset   Cancer Maternal Grandmother    Cancer Paternal Grandmother     Social History Social History   Tobacco Use   Smoking status: Former    Current packs/day: 0.00    Types: Cigarettes    Quit date: 04/01/2012    Years since quitting: 12.0   Smokeless tobacco: Never  Vaping Use   Vaping status: Never Used  Substance Use Topics   Alcohol use: Not Currently    Comment: occasionally   Drug use: Not Currently    Types: Marijuana     Allergies   Patient has no known allergies.   Review of  Systems Review of Systems   Physical Exam Triage Vital Signs ED Triage Vitals  Encounter Vitals Group     BP 04/26/24 0816 112/70     Girls Systolic BP Percentile --      Girls Diastolic BP Percentile --      Boys Systolic BP Percentile --      Boys Diastolic BP Percentile --      Pulse Rate 04/26/24 0816 78     Resp 04/26/24 0816 16     Temp 04/26/24 0816 98.2 F (36.8 C)     Temp Source 04/26/24 0816 Oral     SpO2 04/26/24 0816 97 %     Weight 04/26/24 0815 158 lb (71.7 kg)     Height 04/26/24 0815 5' 6.5 (1.689 m)     Head Circumference --      Peak Flow --      Pain Score 04/26/24 0814 7     Pain Loc --      Pain Education --      Exclude from Growth Chart --    No data found.  Updated Vital Signs BP 112/70 (BP Location: Left Arm)   Pulse 78   Temp 98.2 F (36.8 C) (Oral)   Resp 16   Ht 5' 6.5 (1.689 m)   Wt 71.7 kg   LMP  (LMP Unknown)   SpO2 97%   BMI 25.12 kg/m   Visual Acuity Right Eye Distance:    Left Eye Distance:   Bilateral Distance:    Right Eye Near:   Left Eye Near:    Bilateral Near:     Physical Exam Vitals reviewed.  Constitutional:      General: She is not in acute distress.    Appearance: She is not ill-appearing, toxic-appearing or diaphoretic.   Eyes:     Extraocular Movements: Extraocular movements intact.     Conjunctiva/sclera: Conjunctivae normal.     Pupils: Pupils are equal, round, and reactive to light.     Comments: The left lower eyelid is puffy and there is a slightly raised bump just inferior to the inner canthus about 5 mm x 3 mm.  It is firm and tender.   Skin:    Coloration: Skin is not pale.   Neurological:     Mental Status: She is alert and oriented to person, place, and time.   Psychiatric:        Behavior: Behavior normal.      UC Treatments / Results  Labs (all labs ordered are listed, but only abnormal results are displayed) Labs Reviewed - No data to display  EKG   Radiology   Procedures Procedures (including critical care time)  Medications Ordered in UC Medications - No data to display  Initial Impression / Assessment and Plan / UC Course  I have reviewed the triage vital signs and the nursing notes.  Pertinent labs & imaging results that were available during my care of the patient were reviewed by me and considered in my medical decision making (see chart for details).     I am concerned that this might be cellulitis instead of an insect bite.  Zyrtec is recommended for itching, Keflex is sent in for possible cellulitis and 3 days of prednisone is sent in for potential reaction to an insect bite. Final Clinical Impressions(s) / UC Diagnoses   Final diagnoses:  Cellulitis of face  Reaction to insect bite     Discharge Instructions  Take cephalexin 500 mg--1 capsule 2 times daily for 5 days  Take prednisone 20 mg--2 daily for 3 days  Zyrtec/cetirizine 10 mg tablet over-the-counter--take 1 daily  as needed for allergy or itching.      ED Prescriptions     Medication Sig Dispense Auth. Provider   cephALEXin (KEFLEX) 500 MG capsule Take 1 capsule (500 mg total) by mouth 2 (two) times daily for 5 days. 10 capsule Clorinda Wyble K, MD   predniSONE (DELTASONE) 20 MG tablet Take 2 tablets (40 mg total) by mouth daily with breakfast for 3 days. 6 tablet Ellsworth Haas Marquese Burkland K, MD      PDMP not reviewed this encounter.   Ann Keto, MD 04/26/24 [phone]

## 2024-04-26 NOTE — ED Triage Notes (Signed)
 Patient presenting with left eye swelling after a insect bite onset 2 days ago. Thinks it may have been a spider or an ant. States the are is now painful, itching, and tender.   Prescriptions or OTC medications tried: No, unsure what to take as she is pregnant.

## 2024-04-26 NOTE — Discharge Instructions (Signed)
 Take cephalexin 500 mg--1 capsule 2 times daily for 5 days  Take prednisone 20 mg--2 daily for 3 days  Zyrtec/cetirizine 10 mg tablet over-the-counter--take 1 daily as needed for allergy or itching.

## 2024-05-01 ENCOUNTER — Telehealth (INDEPENDENT_AMBULATORY_CARE_PROVIDER_SITE_OTHER): Payer: Self-pay | Admitting: Obstetrics and Gynecology

## 2024-05-01 DIAGNOSIS — O358XX0 Maternal care for other (suspected) fetal abnormality and damage, not applicable or unspecified: Secondary | ICD-10-CM

## 2024-05-01 DIAGNOSIS — Z3480 Encounter for supervision of other normal pregnancy, unspecified trimester: Secondary | ICD-10-CM

## 2024-05-01 DIAGNOSIS — Z3A24 24 weeks gestation of pregnancy: Secondary | ICD-10-CM

## 2024-05-01 DIAGNOSIS — O09522 Supervision of elderly multigravida, second trimester: Secondary | ICD-10-CM

## 2024-05-01 NOTE — Progress Notes (Signed)
    TELEHEALTH OBSTETRICS VISIT ENCOUNTER NOTE  Provider location: Center for [facility]   Patient location: Home  I connected with Anthony Bateman on 05/01/24 at  2:30 PM EDT by telephone at home and verified that I am speaking with the correct person using two identifiers. Of note, unable to do video encounter due to technical difficulties.    I discussed the limitations, risks, security and privacy concerns of performing an evaluation and management service by telephone and the availability of in person appointments. I also discussed with the patient that there may be a patient responsible charge related to this service. The patient expressed understanding and agreed to proceed.  Subjective:  Megan Nolan is a 38 y.o. [phone] at [redacted]w[redacted]d being followed for ongoing prenatal care.  She is currently monitored for the following issues for this low-risk pregnancy and has Nausea/vomiting in pregnancy; Encounter for supervision of normal pregnancy in multigravida, antepartum; AMA (advanced maternal age) multigravida 35+; and Fetal echogenic intracardiac focus of the heart (EIF) on anatomy scan on their problem list.  Patient reports no complaints. Reports fetal movement. Denies any contractions, bleeding or leaking of fluid.   The following portions of the patient's history were reviewed and updated as appropriate: allergies, current medications, past family history, past medical history, past social history, past surgical history and problem list.   Objective:   General:  Alert, oriented and cooperative.   Mental Status: Normal mood and affect perceived. Normal judgment and thought content.  Rest of physical exam deferred due to type of encounter  Assessment and Plan:  Pregnancy: G4P1021 at [redacted]w[redacted]d 1. Encounter for supervision of normal pregnancy in multigravida, antepartum (Primary) 2. [redacted] weeks gestation of pregnancy Third tri labs, tdap next visit. Reviewed  need for fasting and importance of in person visit Planning circumcision - self pay Peds list given  3. Fetal echogenic intracardiac focus of the heart (EIF) on anatomy scan 4. Multigravida of advanced maternal age in second trimester LR NIPS, anatomy otherwise normal  I discussed the assessment and treatment plan with the patient. The patient was provided an opportunity to ask questions and all were answered. The patient agreed with the plan and demonstrated an understanding of the instructions. The patient was advised to call back or seek an in-person office evaluation/go to MAU at [facility] for any urgent or concerning symptoms. Please refer to After Visit Summary for other counseling recommendations.   I provided 10 minutes of non-face-to-face time during this encounter.  Return in about 4 weeks (around 05/29/2024) for return OB at 28 weeks with 2h GTT, CBC, HIV, RPR, and tdap - in person only.  Izell Marsh, MD Center for [employer], [facility]

## 2024-05-01 NOTE — Patient Instructions (Signed)

## 2024-05-02 ENCOUNTER — Encounter: Payer: Self-pay | Admitting: Obstetrics and Gynecology

## 2024-05-23 ENCOUNTER — Telehealth: Payer: Self-pay

## 2024-05-23 NOTE — Telephone Encounter (Signed)
 Left message for pt to call office back to get scheduled for glucose testing on 7/10

## 2024-05-24 ENCOUNTER — Ambulatory Visit (INDEPENDENT_AMBULATORY_CARE_PROVIDER_SITE_OTHER): Payer: Self-pay | Admitting: Family Medicine

## 2024-05-24 ENCOUNTER — Other Ambulatory Visit: Payer: Self-pay

## 2024-05-24 VITALS — BP 129/76 | HR 80 | Wt 165.0 lb

## 2024-05-24 DIAGNOSIS — K219 Gastro-esophageal reflux disease without esophagitis: Secondary | ICD-10-CM

## 2024-05-24 DIAGNOSIS — Z1331 Encounter for screening for depression: Secondary | ICD-10-CM

## 2024-05-24 DIAGNOSIS — F5101 Primary insomnia: Secondary | ICD-10-CM

## 2024-05-24 DIAGNOSIS — Z3480 Encounter for supervision of other normal pregnancy, unspecified trimester: Secondary | ICD-10-CM

## 2024-05-24 DIAGNOSIS — Z3A28 28 weeks gestation of pregnancy: Secondary | ICD-10-CM

## 2024-05-24 DIAGNOSIS — O09522 Supervision of elderly multigravida, second trimester: Secondary | ICD-10-CM

## 2024-05-24 DIAGNOSIS — Z23 Encounter for immunization: Secondary | ICD-10-CM

## 2024-05-24 DIAGNOSIS — Z3481 Encounter for supervision of other normal pregnancy, first trimester: Secondary | ICD-10-CM

## 2024-05-24 MED ORDER — PANTOPRAZOLE SODIUM 20 MG PO TBEC: 20.0000 mg | DELAYED_RELEASE_TABLET | Freq: Every day | ORAL | 3 refills | Status: DC

## 2024-05-24 MED ORDER — HYDROXYZINE HCL 25 MG PO TABS: 25.0000 mg | ORAL_TABLET | Freq: Every evening | ORAL | 2 refills | Status: DC | PRN

## 2024-05-24 MED ORDER — PRENATAL 28-0.8 MG PO TABS: 1.0000 | ORAL_TABLET | Freq: Every day | ORAL | 12 refills | Status: DC

## 2024-05-24 NOTE — Progress Notes (Addendum)
   PRENATAL VISIT NOTE  Subjective:  Megan Nolan is a 38 y.o. [phone] at [redacted]w[redacted]d being seen today for ongoing prenatal care.  She is currently monitored for the following issues for this high-risk pregnancy and has Nausea/vomiting in pregnancy; Encounter for supervision of normal pregnancy in multigravida, antepartum; AMA (advanced maternal age) multigravida 35+; and Fetal echogenic intracardiac focus of the heart (EIF) on anatomy scan on their problem list.  Patient reports heartburn and insomnia.  Contractions: Irritability. Vag. Bleeding: None.  Movement: Present. Denies leaking of fluid.   The following portions of the patient's history were reviewed and updated as appropriate: allergies, current medications, past family history, past medical history, past social history, past surgical history and problem list.   Objective:    Vitals:   05/24/24 0947  BP: 129/76  Pulse: 80  Weight: 165 lb (74.8 kg)    Fetal Status:  Fetal Heart Rate (bpm): 150   Movement: Present    General: Alert, oriented and cooperative. Patient is in no acute distress.  Skin: Skin is warm and dry. No rash noted.   Cardiovascular: Normal heart rate noted  Respiratory: Normal respiratory effort, no problems with respiration noted  Abdomen: Soft, gravid, appropriate for gestational age.  Pain/Pressure: Absent     Pelvic: Cervical exam deferred        Extremities: Normal range of motion.  Edema: None  Mental Status: Normal mood and affect. Normal behavior. Normal judgment and thought content.   Assessment and Plan:  Pregnancy: G4P1021 at [redacted]w[redacted]d 1. Multigravida of advanced maternal age in second trimester (Primary) LR NIPT  2. Encounter for supervision of normal pregnancy in multigravida, antepartum Continue prenatal care. 28 week labs and TDaP today - Prenatal 28-0.8 MG TABS; Take 1 tablet by mouth daily.  Dispense: 30 tablet; Refill: 12  3. Need for Tdap vaccination - Tdap vaccine greater than or  equal to 7yo IM  5. Gastroesophageal reflux disease without esophagitis PPI refill - pantoprazole  (PROTONIX ) 20 MG tablet; Take 1 tablet (20 mg total) by mouth daily.  Dispense: 30 tablet; Refill: 3  6. Primary insomnia Trial of Atarax  for sleep - hydrOXYzine  (ATARAX ) 25 MG tablet; Take 1 tablet (25 mg total) by mouth at bedtime as needed (insomia).  Dispense: 30 tablet; Refill: 2  7. [redacted] weeks gestation of pregnancy   Preterm labor symptoms and general obstetric precautions including but not limited to vaginal bleeding, contractions, leaking of fluid and fetal movement were reviewed in detail with the patient. Please refer to After Visit Summary for other counseling recommendations.   Return in 2 weeks (on 06/07/2024).  No future appointments.  Glenys GORMAN Birk, MD

## 2024-05-24 NOTE — Patient Instructions (Signed)
 Adopt A mom program through [facility]  Having a circumcision done in the hospital costs approximately $480.  This will have to be paid in full prior to circumcision being performed.  There are places to have circumcision done as an outpatient which are cheaper.    Places to have your son circumcised (for self-pay patients//Medicaid now covers circumcisions):                                                                      [facility]     [phone]   $480 while you are in hospital         [facility]              [phone]   $269 by 4 wks                      [facility]                     [phone]   $269 by 7 days [facility]                    [phone]   $269 by 4 wks [facility]             [phone]   $225 by 2 wks    These prices sometimes change but are roughly what you can expect to pay. Please call and confirm pricing.   There are many reasons parents decide to have their sons circumsized. During the first year of life circumcised males have a reduced risk of urinary tract infections but after this year the rates between circumcised males and uncircumcised males are the same.  It can reduce rate of HIV and other STI infection later in life.  It is safe to have your son circumcised outside of the hospital and the places above perform them regularly.   Deciding about Circumcision in Baby Boys  (Up-to-date The Basics)  What is circumcision?   Circumcision is a surgery that removes the skin that covers the tip of the penis, called the foreskin Circumcision is usually done when a boy is between 43 and 42 days old. In the [place] , circumcision is common. In some other countries, fewer boys are circumcised. Circumcision is a common tradition in some religions.  Should I have my baby boy circumcised?   There is no easy answer. Circumcision has some benefits. But it also has risks. After talking with your doctor, you will have to decide for yourself what is right for your  family.  What are the benefits of circumcision?   Circumcised boys seem to have slightly lower rates of: ?Urinary tract infections ?Swelling of the opening at the tip of the penis Circumcised men seem to have slightly lower rates of: ?Urinary tract infections ?Swelling of the opening at the tip of the penis ?Penis cancer ?HIV and other infections that you catch during sex, or transmit to a future partner(s) ?Cervical cancer in the women they have sex with Even so, in the [place] , the risks of these problems are small - even in boys and men who have not been circumcised. Plus, boys and men who are not circumcised can reduce  these extra risks by: ?Cleaning their penis well ?Using condoms during sex  What are the risks of circumcision?  Risks include: ?Bleeding or infection from the surgery ?Damage to or amputation of the penis ?A chance that the doctor will cut off too much or not enough of the foreskin ?A chance that sex won't feel as good later in life Only about 1 out of every 200 circumcisions leads to problems. There is also a chance that your health insurance won't pay for circumcision.  How is circumcision done in baby boys?  First, the baby gets medicine for pain relief. This might be a cream on the skin or a shot into the base of the penis. Next, the doctor cleans the baby's penis well. Then he or she uses special tools to cut off the foreskin. Finally, the doctor wraps a bandage (called gauze) around the baby's penis. If you have your baby circumcised, his doctor or nurse will give you instructions on how to care for him after the surgery. It is important that you follow those instructions carefully.

## 2024-05-24 NOTE — Progress Notes (Signed)
 ROB   2hr GTT today. T-Dap: ? Handout provided pt will let me know.  CC: heartburn/insomnia would like to discuss Rx management.

## 2024-05-25 ENCOUNTER — Ambulatory Visit: Payer: Self-pay | Admitting: Family Medicine

## 2024-05-25 DIAGNOSIS — Z3480 Encounter for supervision of other normal pregnancy, unspecified trimester: Secondary | ICD-10-CM

## 2024-05-25 LAB — GLUCOSE TOLERANCE, 2 HOURS W/ 1HR
Glucose, 1 hour: 93 mg/dL (ref 70–179)
Glucose, 2 hour: 103 mg/dL (ref 70–152)
Glucose, Fasting: 90 mg/dL (ref 70–91)

## 2024-05-25 LAB — HIV ANTIBODY (ROUTINE TESTING W REFLEX): HIV Screen 4th Generation wRfx: NONREACTIVE

## 2024-05-25 LAB — CBC
Hematocrit: 32.6 % — ABNORMAL LOW (ref 34.0–46.6)
Hemoglobin: 10.8 g/dL — ABNORMAL LOW (ref 11.1–15.9)
MCH: 35.1 pg — ABNORMAL HIGH (ref 26.6–33.0)
MCHC: 33.1 g/dL (ref 31.5–35.7)
MCV: 106 fL — ABNORMAL HIGH (ref 79–97)
Platelets: 300 x10E3/uL (ref 150–450)
RBC: 3.08 x10E6/uL — ABNORMAL LOW (ref 3.77–5.28)
RDW: 11.7 % (ref 11.7–15.4)
WBC: 10.9 x10E3/uL — ABNORMAL HIGH (ref 3.4–10.8)

## 2024-05-25 LAB — RPR: RPR Ser Ql: NONREACTIVE

## 2024-05-29 ENCOUNTER — Telehealth: Payer: Self-pay

## 2024-05-29 NOTE — Telephone Encounter (Signed)
 TC from pt c/o pain after T-Dap injection Pt notes stiffness, hard to lift arm,not able to sleep, and knot. I consulted w/ provider and provider advised pt seek urgent care for evaluation Pt agreeable and voiced understanding.

## 2024-06-05 ENCOUNTER — Ambulatory Visit (INDEPENDENT_AMBULATORY_CARE_PROVIDER_SITE_OTHER): Payer: Self-pay | Admitting: Family Medicine

## 2024-06-05 VITALS — BP 110/71 | HR 82 | Wt 166.0 lb

## 2024-06-05 DIAGNOSIS — Z3A29 29 weeks gestation of pregnancy: Secondary | ICD-10-CM

## 2024-06-05 DIAGNOSIS — O09523 Supervision of elderly multigravida, third trimester: Secondary | ICD-10-CM

## 2024-06-05 DIAGNOSIS — Z3480 Encounter for supervision of other normal pregnancy, unspecified trimester: Secondary | ICD-10-CM

## 2024-06-05 NOTE — Progress Notes (Signed)
 ROB   Pt asked about baby's presentation. When is it appropriate to be head down.  Was not able to get heart burn Rx due to cost.   *Safe Med list attached to pt instructions

## 2024-06-05 NOTE — Patient Instructions (Signed)
  Safe Medications in Pregnancy  that are over the counter  Acne:  Benzoyl Peroxide  Salicylic Acid  *NO-Retin A  Backache/Headache:  Tylenol: 2 regular strength every 4 hours OR               2 Extra strength every 6 hours   Colds/Coughs/Allergies: Allegra Benadryl (alcohol free) 25 mg every 6 hours as needed  Breath right strips  Claritin  Cepacol throat lozenges  Chloraseptic throat spray  Cold-Eeze- up to three times per day  Cough drops, alcohol free  Flonase Guaifenesin  Mucinex (plain/DM) Nasacort Robitussin DM (plain only, alcohol free)  Saline nasal spray/drops Steroid nasal sprays Sudafed (pseudoephedrine) & Actifed * use only after [redacted] weeks gestation and if you do not have high blood pressure  Tylenol  Vicks Vaporub  Zicam Zinc lozenges  Zyrtec   -Make sure to not take anything that has the active ingredient Phenylephrine   Constipation:  Colace  Ducolax suppositories  Fleet enema  Glycerin suppositories  Metamucil  Milk of magnesia  Miralax  Senokot  Smooth move tea   Diarrhea:  Kaopectate  Imodium A-D   *NO Pepto Bismol   Hemorrhoids:  Anusol  Anusol HC  Preparation H  Tucks   Indigestion:  Tums  Maalox  Mylanta  Nexium Pepcid  Zantac Prevacid Protonix Prilosec   Insomnia:  Benadryl (alcohol free) 25mg every 6 hours as needed  Tylenol PM  Unisom  Leg Cramps:  Tums  Magnesium tablets  Nausea/Vomiting:  Bonine  Dramamine  Emetrol  Ginger extract  Sea bands  Meclizine   Nausea medication to take during pregnancy:  Unisom (doxylamine succinate 25 mg tablets) Take one tablet daily at bedtime. If symptoms are not adequately controlled, the dose can be increased to a maximum recommended dose of two tablets daily (1/2 tablet in the morning, 1/2 tablet mid-afternoon and one at bedtime).  Vitamin B6 100mg tablets. Take one tablet twice a day (up to 200 mg per day).   Skin Rashes:  Aveeno products  Benadryl cream or  Benadryl tablets 25mg every 6 hours as needed  Calamine Lotion  1% Hydrocortisone cream   Yeast infection:  Gyne-lotrimin 7  Monistat 3 or 7day    **If taking multiple medications, please check labels to avoid duplicating the same active ingredients  **take medication as directed on the label ** ** Do not exceed 4000 mg of Tylenol in 24 hours**  **Do not take medications that contain Aspirin or Ibuprofen** Unless your doctor has prescribed low dose Aspirin for prevention of  Pre-eclampsia       

## 2024-06-05 NOTE — Progress Notes (Signed)
    PRENATAL VISIT NOTE  Subjective:  Megan Nolan is a 38 y.o. [phone] at [redacted]w[redacted]d being seen today for ongoing prenatal care.  She is currently monitored for the following issues for this low-risk pregnancy and has Nausea/vomiting in pregnancy; Encounter for supervision of normal pregnancy in multigravida, antepartum; AMA (advanced maternal age) multigravida 35+; and Fetal echogenic intracardiac focus of the heart (EIF) on anatomy scan on their problem list.  Patient reports no complaints.  Contractions: Not present. Vag. Bleeding: None.  Movement: Present. Denies leaking of fluid.   The following portions of the patient's history were reviewed and updated as appropriate: allergies, current medications, past family history, past medical history, past social history, past surgical history and problem list.   Objective:    Vitals:   06/05/24 0950  BP: 110/71  Pulse: 82  Weight: 166 lb (75.3 kg)    Fetal Status:  Fetal Heart Rate (bpm): 140 Fundal Height: 30 cm Movement: Present    General: Alert, oriented and cooperative. Patient is in no acute distress.  Skin: Skin is warm and dry. No rash noted.   Cardiovascular: Normal heart rate noted  Respiratory: Normal respiratory effort, no problems with respiration noted  Abdomen: Soft, gravid, appropriate for gestational age.  Pain/Pressure: Present     Pelvic: Cervical exam deferred        Extremities: Normal range of motion.  Edema: None  Mental Status: Normal mood and affect. Normal behavior. Normal judgment and thought content.   Assessment and Plan:  Pregnancy: G4P1021 at [redacted]w[redacted]d 1. Multigravida of advanced maternal age in third trimester (Primary) LR NIPT  2. Encounter for supervision of normal pregnancy in multigravida, antepartum Continue routine prenatal care. Normal 28 week labs Will try OTC PPI for GERD  3. [redacted] weeks gestation of pregnancy   Preterm labor symptoms and general obstetric precautions including but not  limited to vaginal bleeding, contractions, leaking of fluid and fetal movement were reviewed in detail with the patient. Please refer to After Visit Summary for other counseling recommendations.   Return in 2 weeks (on 06/19/2024).  Future Appointments  Date Time Provider Department Center  06/21/2024  9:35 AM Fredirick Glenys RAMAN, MD [facility]  07/05/2024  9:35 AM Alger, Winton, MD [facility]  07/19/2024  9:35 AM Herchel, Gloris LABOR, MD [facility]  07/26/2024  9:35 AM Herchel, Gloris LABOR, MD [facility]  08/02/2024  9:35 AM Fredirick Glenys RAMAN, MD [facility]  08/09/2024  9:35 AM Anyanwu, Gloris LABOR, MD [facility]    Glenys RAMAN Fredirick, MD

## 2024-06-21 ENCOUNTER — Encounter: Payer: Self-pay | Admitting: Family Medicine

## 2024-06-21 NOTE — Progress Notes (Unsigned)
 ROB:  bladder - can't hold it  Can't sleep - has been up since 2am ---can take 2 tabs ? Nausea

## 2024-06-22 ENCOUNTER — Telehealth (INDEPENDENT_AMBULATORY_CARE_PROVIDER_SITE_OTHER): Payer: Self-pay | Admitting: Obstetrics & Gynecology

## 2024-06-22 ENCOUNTER — Encounter: Payer: Self-pay | Admitting: Obstetrics & Gynecology

## 2024-06-22 DIAGNOSIS — Z3A32 32 weeks gestation of pregnancy: Secondary | ICD-10-CM

## 2024-06-22 DIAGNOSIS — Z3480 Encounter for supervision of other normal pregnancy, unspecified trimester: Secondary | ICD-10-CM

## 2024-06-22 MED ORDER — PRENATAL 28-0.8 MG PO TABS: 1.0000 | ORAL_TABLET | Freq: Every day | ORAL | 12 refills | Status: AC

## 2024-06-22 NOTE — Patient Instructions (Signed)

## 2024-06-22 NOTE — Progress Notes (Signed)
 This encounter was created in error - please disregard.  Pt. Could not wait for provider and so scheduled for next day.

## 2024-06-22 NOTE — Progress Notes (Signed)
   OBSTETRICS PRENATAL VIRTUAL VISIT ENCOUNTER NOTE  Provider location: Center for [facility]   Patient location: Home  I connected with Megan Nolan on 06/22/24 at  8:35 AM EDT by MyChart Video Encounter and verified that I am speaking with the correct person using two identifiers. I discussed the limitations, risks, security and privacy concerns of performing an evaluation and management service virtually and the availability of in person appointments. I also discussed with the patient that there may be a patient responsible charge related to this service. The patient expressed understanding and agreed to proceed. Subjective:  Megan Nolan is a 38 y.o. [phone] at [redacted]w[redacted]d being seen today for ongoing prenatal care.  She is currently monitored for the following issues for this low-risk pregnancy and has Nausea/vomiting in pregnancy; Encounter for supervision of normal pregnancy in multigravida, antepartum; AMA (advanced maternal age) multigravida 35+; and Fetal echogenic intracardiac focus of the heart (EIF) on anatomy scan on their problem list.  Patient reports no concerning complaints. Desires refill of prenatal vitamins.  Contractions: Irritability. Vag. Bleeding: None.  Movement: Present. Denies any leaking of fluid.   The following portions of the patient's history were reviewed and updated as appropriate: allergies, current medications, past family history, past medical history, past social history, past surgical history and problem list.   Objective:    There were no vitals filed for this visit.  Fetal Status:      Movement: Present    General: Alert, oriented and cooperative. Patient is in no acute distress.  Respiratory: Normal respiratory effort, no problems with respiration noted  Mental Status: Normal mood and affect. Normal behavior. Normal judgment and thought content.  Rest of physical exam deferred due to type of encounter  Imaging: No  results found.  Assessment and Plan:  Pregnancy: G4P1021 at [redacted]w[redacted]d 1. [redacted] weeks gestation of pregnancy 2. Encounter for supervision of normal pregnancy in multigravida, antepartum (Primary) - Prenatal 28-0.8 MG TABS; Take 1 tablet by mouth daily.  Dispense: 30 tablet; Refill: 12 Third trimester expectations reviewed and all questions answered.  Reassured about multiple concerns raised by patient. Preterm labor symptoms and general obstetric precautions including but not limited to vaginal bleeding, contractions, leaking of fluid and fetal movement were reviewed in detail with the patient. I discussed the assessment and treatment plan with the patient. The patient was provided an opportunity to ask questions and all were answered. The patient agreed with the plan and demonstrated an understanding of the instructions. The patient was advised to call back or seek an in-person office evaluation/go to MAU at [facility] for any urgent or concerning symptoms. Please refer to After Visit Summary for other counseling recommendations.   I provided 12 minutes of face-to-face time during this encounter.  Return in 2 weeks (on 07/06/2024) for OB visits  as scheduled.  Future Appointments  Date Time Provider Department Center  07/05/2024  9:35 AM Constant, Winton, MD [facility]  07/19/2024  9:35 AM Tamirra Sienkiewicz, Gloris LABOR, MD [facility]  07/26/2024  9:35 AM Luc Shammas, Gloris LABOR, MD [facility]  08/02/2024  9:35 AM Fredirick Glenys RAMAN, MD [facility]  08/09/2024  9:35 AM Hisayo Delossantos, Gloris LABOR, MD [facility]    Gloris Hugger, MD Center for [facility], [facility]

## 2024-06-26 ENCOUNTER — Telehealth: Payer: Self-pay

## 2024-06-26 DIAGNOSIS — O219 Vomiting of pregnancy, unspecified: Secondary | ICD-10-CM

## 2024-06-26 MED ORDER — ONDANSETRON 4 MG PO TBDP: 4.0000 mg | ORAL_TABLET | Freq: Four times a day (QID) | ORAL | 0 refills | Status: DC | PRN

## 2024-06-26 NOTE — Telephone Encounter (Signed)
 Return call to pt regarding sx's of vomiting not able to keep food down. Pt has not tried any treatment, comfort measures just yet. Advised for  liquid IV, BRAT DIET, OTC Nexium since Rx  that was sent for heartburn/Reflux was over $100.  Offered Rx to be sent in for nausea / vomiting however I let pt know I was not sure of cost Advised if sx's worsen and vomiting pt needs to go to MAU.   Pt agreeable and voiced understanding.

## 2024-07-05 ENCOUNTER — Encounter: Payer: Self-pay | Admitting: Obstetrics and Gynecology

## 2024-07-05 NOTE — Progress Notes (Unsigned)
 Patient opted to reschedule virtual ROB before speaking to RN or MD

## 2024-07-10 ENCOUNTER — Encounter: Payer: Self-pay | Admitting: Family Medicine

## 2024-07-10 NOTE — Progress Notes (Signed)
Patient did not keep appointment today. She will be called to reschedule.  This encounter was created in error - please disregard.

## 2024-07-19 ENCOUNTER — Other Ambulatory Visit (HOSPITAL_COMMUNITY)
Admission: RE | Admit: 2024-07-19 | Discharge: 2024-07-19 | Disposition: A | Discharge status: Home / Self Care | Payer: Self-pay | Source: Ambulatory Visit | Attending: Obstetrics & Gynecology | Admitting: Obstetrics & Gynecology

## 2024-07-19 ENCOUNTER — Encounter: Payer: Self-pay | Admitting: Obstetrics & Gynecology

## 2024-07-19 ENCOUNTER — Ambulatory Visit (INDEPENDENT_AMBULATORY_CARE_PROVIDER_SITE_OTHER): Payer: Self-pay | Admitting: Obstetrics & Gynecology

## 2024-07-19 VITALS — BP 118/76 | HR 82 | Wt 170.0 lb

## 2024-07-19 DIAGNOSIS — Z3480 Encounter for supervision of other normal pregnancy, unspecified trimester: Secondary | ICD-10-CM | POA: Insufficient documentation

## 2024-07-19 DIAGNOSIS — Z3A36 36 weeks gestation of pregnancy: Secondary | ICD-10-CM

## 2024-07-19 DIAGNOSIS — Z3483 Encounter for supervision of other normal pregnancy, third trimester: Secondary | ICD-10-CM

## 2024-07-19 NOTE — Patient Instructions (Signed)

## 2024-07-19 NOTE — Progress Notes (Signed)
   PRENATAL VISIT NOTE  Subjective:  Megan Nolan is a 38 y.o. [phone] at [redacted]w[redacted]d being seen today for ongoing prenatal care.  She is currently monitored for the following issues for this low-risk pregnancy and has Nausea/vomiting in pregnancy; Encounter for supervision of normal pregnancy in multigravida, antepartum; AMA (advanced maternal age) multigravida 35+; and Fetal echogenic intracardiac focus of the heart (EIF) on anatomy scan on their problem list.  Patient reports no complaints.  Contractions: Irregular. Vag. Bleeding: None.  Movement: Present. Denies leaking of fluid.   The following portions of the patient's history were reviewed and updated as appropriate: allergies, current medications, past family history, past medical history, past social history, past surgical history and problem list.   Objective:    Vitals:   07/19/24 0953  BP: 118/76  Pulse: 82  Weight: 170 lb (77.1 kg)    Fetal Status:  Fetal Heart Rate (bpm): 145   Movement: Present Presentation: Vertex (Verified on bedside ultrasound)  General: Alert, oriented and cooperative. Patient is in no acute distress.  Skin: Skin is warm and dry. No rash noted.   Cardiovascular: Normal heart rate noted  Respiratory: Normal respiratory effort, no problems with respiration noted  Abdomen: Soft, gravid, appropriate for gestational age.  Pain/Pressure: Present     Pelvic: Cervical exam performed in the presence of a chaperone Dilation: Closed Effacement (%): 50 Station: Ballotable. Cultures obtained.  Extremities: Normal range of motion.  Edema: None  Mental Status: Normal mood and affect. Normal behavior. Normal judgment and thought content.   Assessment and Plan:  Pregnancy: G4P1021 at [redacted]w[redacted]d 1. [redacted] weeks gestation of pregnancy 2. Encounter for supervision of normal pregnancy in multigravida, antepartum (Primary) - Cervicovaginal ancillary only - Strep Gp B NAA Follow up pelvic cultures.  Preterm labor symptoms and  general obstetric precautions including but not limited to vaginal bleeding, contractions, leaking of fluid and fetal movement were reviewed in detail with the patient. Please refer to After Visit Summary for other counseling recommendations.   Return in about 1 week (around 07/26/2024) for OFFICE OB VISIT (MD or APP).  Future Appointments  Date Time Provider Department Center  07/26/2024  9:35 AM Akia Desroches, Gloris LABOR, MD [facility]  08/02/2024  9:35 AM Fredirick Glenys RAMAN, MD [facility]  08/09/2024  9:35 AM Valary Manahan, Gloris LABOR, MD [facility]    Gloris Hugger, MD

## 2024-07-20 LAB — CERVICOVAGINAL ANCILLARY ONLY
Chlamydia: NEGATIVE
Comment: NEGATIVE
Comment: NORMAL
Neisseria Gonorrhea: NEGATIVE

## 2024-07-21 LAB — STREP GP B NAA: Strep Gp B NAA: POSITIVE — AB

## 2024-07-23 ENCOUNTER — Ambulatory Visit: Payer: Self-pay | Admitting: Obstetrics & Gynecology

## 2024-07-23 ENCOUNTER — Encounter: Payer: Self-pay | Admitting: Obstetrics & Gynecology

## 2024-07-23 DIAGNOSIS — O99820 Streptococcus B carrier state complicating pregnancy: Secondary | ICD-10-CM | POA: Insufficient documentation

## 2024-07-26 ENCOUNTER — Telehealth: Payer: Self-pay | Admitting: Obstetrics & Gynecology

## 2024-07-26 VITALS — Wt 166.7 lb

## 2024-07-26 DIAGNOSIS — Z3483 Encounter for supervision of other normal pregnancy, third trimester: Secondary | ICD-10-CM

## 2024-07-26 DIAGNOSIS — Z3480 Encounter for supervision of other normal pregnancy, unspecified trimester: Secondary | ICD-10-CM

## 2024-07-26 DIAGNOSIS — Z3A37 37 weeks gestation of pregnancy: Secondary | ICD-10-CM

## 2024-07-26 NOTE — Progress Notes (Signed)
   OBSTETRICS PRENATAL VIRTUAL VISIT ENCOUNTER NOTE  Provider location: Center for [facility]   Patient location: Home  I connected with Megan Nolan on 07/26/24 at  9:35 AM EDT by MyChart Video Encounter and verified that I am speaking with the correct person using two identifiers. I discussed the limitations, risks, security and privacy concerns of performing an evaluation and management service virtually and the availability of in person appointments. I also discussed with the patient that there may be a patient responsible charge related to this service. The patient expressed understanding and agreed to proceed. Subjective:  Megan Nolan is a 38 y.o. [phone] at [redacted]w[redacted]d being seen today for ongoing prenatal care.  She is currently monitored for the following issues for this low-risk pregnancy and has Nausea/vomiting in pregnancy; Encounter for supervision of normal pregnancy in multigravida, antepartum; AMA (advanced maternal age) multigravida 35+; Fetal echogenic intracardiac focus of the heart (EIF) on anatomy scan; and Group B Streptococcus carrier, +RV culture, currently pregnant on their problem list.  Patient reports no complaints.  Contractions: Irritability. Vag. Bleeding: None.  Movement: Present. Denies any leaking of fluid.   The following portions of the patient's history were reviewed and updated as appropriate: allergies, current medications, past family history, past medical history, past social history, past surgical history and problem list.   Objective:    Vitals:   07/26/24 0932  Weight: 166 lb 11.2 oz (75.6 kg)  She will send her BP via MyChart later  Fetal Status:      Movement: Present    General: Alert, oriented and cooperative. Patient is in no acute distress.  Respiratory: Normal respiratory effort, no problems with respiration noted  Mental Status: Normal mood and affect. Normal behavior. Normal judgment and thought content.   Rest of physical exam deferred due to type of encounter  Imaging: No results found.  Assessment and Plan:  Pregnancy: G4P1021 at [redacted]w[redacted]d 1. [redacted] weeks gestation of pregnancy 2. Encounter for supervision of normal pregnancy in multigravida, antepartum (Primary) No concerns today.  Labor symptoms and general obstetric precautions including but not limited to vaginal bleeding, contractions, leaking of fluid and fetal movement were reviewed in detail with the patient. I discussed the assessment and treatment plan with the patient. The patient was provided an opportunity to ask questions and all were answered. The patient agreed with the plan and demonstrated an understanding of the instructions. The patient was advised to call back or seek an in-person office evaluation/go to MAU at [facility] for any urgent or concerning symptoms. Please refer to After Visit Summary for other counseling recommendations.   I provided 7 minutes of face-to-face time during this encounter.  Return in about 1 week (around 08/02/2024) for OFFICE OB VISIT (MD or APP).  Future Appointments  Date Time Provider Department Center  08/02/2024  9:35 AM Fredirick Glenys RAMAN, MD [facility]  08/09/2024  9:35 AM Alanah Sakuma, Gloris LABOR, MD [facility]    Gloris Hugger, MD Center for [facility], [facility]

## 2024-07-26 NOTE — Patient Instructions (Addendum)
 Return to office for any scheduled appointments. Call the office or go to the MAU at [facility] at [facility] if: You begin to have strong, frequent contractions Your water  breaks.  Sometimes it is a big gush of fluid, sometimes it is just a trickle that keeps getting your underwear wet or running down your legs You have vaginal bleeding.  It is normal to have a small amount of spotting if your cervix was checked.  You do not feel your baby moving like normal.  If you do not, get something to eat and drink and lay down and focus on feeling your baby move.   If your baby is still not moving like normal, you should call the office or go to MAU. Any other obstetric concerns.     Places to have your son circumcised (for self-pay patients//Medicaid now covers circumcisions):                                                                      [facility]     [phone]   [phone] while you are in hospital         [facility]              [phone]   $[phone] by 4 wks                        These prices sometimes change but are roughly what you can expect to pay. Please call and confirm pricing.   There are many reasons parents decide to have their sons circumsized. During the first year of life circumcised males have a reduced risk of urinary tract infections but after this year the rates between circumcised males and uncircumcised males are the same.  It can reduce rate of HIV and other STI infection later in life.  It is safe to have your son circumcised outside of the hospital and the places above perform them regularly.   Deciding about Circumcision in Baby Boys  (Up-to-date The Basics)  What is circumcision?   Circumcision is a surgery that removes the skin that covers the tip of the penis, called the foreskin Circumcision is usually done when a boy is between 54 and 6 days old. In the [place] , circumcision is common. In some other countries, fewer boys are circumcised.  Circumcision is a common tradition in some religions.  Should I have my baby boy circumcised?   There is no easy answer. Circumcision has some benefits. But it also has risks. After talking with your doctor, you will have to decide for yourself what is right for your family.  What are the benefits of circumcision?   Circumcised boys seem to have slightly lower rates of: ?Urinary tract infections ?Swelling of the opening at the tip of the penis Circumcised men seem to have slightly lower rates of: ?Urinary tract infections ?Swelling of the opening at the tip of the penis ?Penis cancer ?HIV and other infections that you catch during sex, or transmit to a future partner(s) ?Cervical cancer in the women they have sex with Even so, in the [place] , the risks of these problems are small - even in boys and men who have  not been circumcised. Plus, boys and men who are not circumcised can reduce these extra risks by: ?Cleaning their penis well ?Using condoms during sex  What are the risks of circumcision?  Risks include: ?Bleeding or infection from the surgery ?Damage to or amputation of the penis ?A chance that the doctor will cut off too much or not enough of the foreskin ?A chance that sex won't feel as good later in life Only about 1 out of every 200 circumcisions leads to problems. There is also a chance that your health insurance won't pay for circumcision.  How is circumcision done in baby boys?  First, the baby gets medicine for pain relief. This might be a cream on the skin or a shot into the base of the penis. Next, the doctor cleans the baby's penis well. Then he or she uses special tools to cut off the foreskin. Finally, the doctor wraps a bandage (called gauze) around the baby's penis. If you have your baby circumcised, his doctor or nurse will give you instructions on how to care for him after the surgery. It is important that you follow those instructions  carefully.   Deciding about Circumcision in Baby Boys  (The Basics)  What is circumcision?   Circumcision is a surgery that removes the skin that covers the tip of the penis, called the foreskin Circumcision is usually done when a boy is between 80 and 53 days old. In the [place] , circumcision is common. In some other countries, fewer boys are circumcised. Circumcision is a common tradition in some religions.  Should I have my baby boy circumcised?   There is no easy answer. Circumcision has some benefits. But it also has risks. After talking with your doctor, you will have to decide for yourself what is right for your family.  What are the benefits of circumcision?   Circumcised boys seem to have slightly lower rates of: ?Urinary tract infections ?Swelling of the opening at the tip of the penis Circumcised men seem to have slightly lower rates of: ?Urinary tract infections ?Swelling of the opening at the tip of the penis ?Penis cancer ?HIV and other infections that you catch during sex ?Cervical cancer in the women they have sex with Even so, in the [place] , the risks of these problems are small - even in boys and men who have not been circumcised. Plus, boys and men who are not circumcised can reduce these extra risks by: ?Cleaning their penis well ?Using condoms during sex  What are the risks of circumcision?  Risks include: ?Bleeding or infection from the surgery ?Damage to or amputation of the penis ?A chance that the doctor will cut off too much or not enough of the foreskin ?A chance that sex won't feel as good later in life Only about 1 out of every 200 circumcisions leads to problems. There is also a chance that your health insurance won't pay for circumcision.  How is circumcision done in baby boys?  First, the baby gets medicine for pain relief. This might be a cream on the skin or a shot into the base of the penis. Next, the doctor cleans the baby's  penis well. Then he or she uses special tools to cut off the foreskin. Finally, the doctor wraps a bandage (called gauze) around the baby's penis. If you have your baby circumcised, his doctor or nurse will give you instructions on how to care for him after the surgery. It is important that you  follow those instructions carefully.

## 2024-08-02 ENCOUNTER — Ambulatory Visit: Payer: Self-pay | Admitting: *Deleted

## 2024-08-02 ENCOUNTER — Telehealth: Payer: Self-pay | Admitting: Family Medicine

## 2024-08-02 VITALS — BP 112/73 | HR 84 | Wt 172.0 lb

## 2024-08-02 DIAGNOSIS — Z3480 Encounter for supervision of other normal pregnancy, unspecified trimester: Secondary | ICD-10-CM

## 2024-08-02 DIAGNOSIS — O368130 Decreased fetal movements, third trimester, not applicable or unspecified: Secondary | ICD-10-CM

## 2024-08-02 DIAGNOSIS — Z3A38 38 weeks gestation of pregnancy: Secondary | ICD-10-CM

## 2024-08-02 DIAGNOSIS — O09523 Supervision of elderly multigravida, third trimester: Secondary | ICD-10-CM

## 2024-08-02 DIAGNOSIS — O99820 Streptococcus B carrier state complicating pregnancy: Secondary | ICD-10-CM

## 2024-08-02 DIAGNOSIS — O368131 Decreased fetal movements, third trimester, fetus 1: Secondary | ICD-10-CM

## 2024-08-02 DIAGNOSIS — O219 Vomiting of pregnancy, unspecified: Secondary | ICD-10-CM

## 2024-08-02 NOTE — Progress Notes (Signed)
   OBSTETRICS PRENATAL VIRTUAL VISIT ENCOUNTER NOTE  Provider location: Center for [facility]   Patient location: Home  I connected with Megan Nolan on 08/02/24 at  9:35 AM EDT by MyChart Video Encounter and verified that I am speaking with the correct person using two identifiers. I discussed the limitations, risks, security and privacy concerns of performing an evaluation and management service virtually and the availability of in person appointments. I also discussed with the patient that there may be a patient responsible charge related to this service. The patient expressed understanding and agreed to proceed. Subjective:  Megan Nolan is a 38 y.o. [phone] at [redacted]w[redacted]d being seen today for ongoing prenatal care.  She is currently monitored for the following issues for this low-risk pregnancy and has Nausea/vomiting in pregnancy; Encounter for supervision of normal pregnancy in multigravida, antepartum; AMA (advanced maternal age) multigravida 35+; Fetal echogenic intracardiac focus of the heart (EIF) on anatomy scan; and Group B Streptococcus carrier, +RV culture, currently pregnant on their problem list.  Patient reports no complaints.  Contractions: Irritability. Vag. Bleeding: None.  Movement: (!) Decreased. Denies any leaking of fluid.   The following portions of the patient's history were reviewed and updated as appropriate: allergies, current medications, past family history, past medical history, past social history, past surgical history and problem list.   Objective:    Vitals:    Fetal Status:      Movement: (!) Decreased    General: Alert, oriented and cooperative. Patient is in no acute distress.  Respiratory: Normal respiratory effort, no problems with respiration noted  Mental Status: Normal mood and affect. Normal behavior. Normal judgment and thought content.  Rest of physical exam deferred due to type of encounter  Imaging: No  results found.  Assessment and Plan:  Pregnancy: G4P1021 at [redacted]w[redacted]d 1. Group B Streptococcus carrier, +RV culture, currently pregnant (Primary) Will need treatment in labor  2. Multigravida of advanced maternal age in third trimester LR NIPT  3. Encounter for supervision of normal pregnancy in multigravida, antepartum Continue prenatal care.  4. Nausea/vomiting in pregnancy On Meds  5. [redacted] weeks gestation of pregnancy   6. Decreased fetal movements in third trimester, single or unspecified fetus To come in for NST later today   Term labor symptoms and general obstetric precautions including but not limited to vaginal bleeding, contractions, leaking of fluid and fetal movement were reviewed in detail with the patient. I discussed the assessment and treatment plan with the patient. The patient was provided an opportunity to ask questions and all were answered. The patient agreed with the plan and demonstrated an understanding of the instructions. The patient was advised to call back or seek an in-person office evaluation/go to MAU at [facility] for any urgent or concerning symptoms. Please refer to After Visit Summary for other counseling recommendations.   I provided 6 minutes of face-to-face time during this encounter.  Return in 1 week (on 08/09/2024).  Future Appointments  Date Time Provider Department Center  08/09/2024  9:35 AM Anyanwu, Gloris LABOR, MD [facility]    Megan GORMAN Birk, MD Center for [facility], [facility]

## 2024-08-02 NOTE — Progress Notes (Signed)
 ROB   Pt states decrease in FM if she moves belly baby will move .

## 2024-08-02 NOTE — Progress Notes (Signed)
 Pt here for NST due to decreased movement.   NST reactive   Wanda Buckles, RN

## 2024-08-09 ENCOUNTER — Ambulatory Visit: Payer: Self-pay | Admitting: Obstetrics & Gynecology

## 2024-08-09 ENCOUNTER — Encounter: Payer: Self-pay | Admitting: Obstetrics & Gynecology

## 2024-08-09 VITALS — BP 123/79 | HR 88 | Wt 174.0 lb

## 2024-08-09 DIAGNOSIS — Z3A39 39 weeks gestation of pregnancy: Secondary | ICD-10-CM

## 2024-08-09 DIAGNOSIS — Z3480 Encounter for supervision of other normal pregnancy, unspecified trimester: Secondary | ICD-10-CM

## 2024-08-09 DIAGNOSIS — O09523 Supervision of elderly multigravida, third trimester: Secondary | ICD-10-CM

## 2024-08-09 NOTE — Patient Instructions (Signed)
 Return to office for any scheduled appointments. Call the office or go to the MAU at [facility] if: You begin to have strong, frequent contractions Your water breaks.  Sometimes it is a big gush of fluid, sometimes it is just a trickle that keeps getting your underwear wet or running down your legs You have vaginal bleeding.  It is normal to have a small amount of spotting if your cervix was checked.  You do not feel your baby moving like normal.  If you do not, get something to eat and drink and lay down and focus on feeling your baby move.   If your baby is still not moving like normal, you should call the office or go to MAU. Any other obstetric concerns.    Things to Try After 37 weeks for Cervical Ripening (to get your cervix ready for labor) : May try one or all:    Try the [employer] at [email or web address].[email or web address] daily to improve baby's position and encourage the onset of labor.  Cervical Ripening: May try one or both Red Raspberry Leaf capsules or tea:  two 300mg  or 400mg  tablets with each meal, 2-3 times a day, or 1-3 cups of tea daily  Potential Side Effects Of Raspberry Leaf:  Most women do not experience any side effects from drinking raspberry leaf tea. However, nausea and loose stools are possible   Evening Primrose Oil capsules: take 1 capsule by mouth and place one capsule in the vagina every night.    Some of the potential side effects:  Upset stomach  Loose stools or diarrhea  Headaches  Nausea    3.  6 Dates a day (may taste better if warmed in microwave until soft). Found where raisins are in the grocery store

## 2024-08-09 NOTE — Addendum Note (Signed)
 Addended by: HERCHEL GRUMET A on: 08/09/2024 11:54 AM   Modules accepted: Orders

## 2024-08-09 NOTE — Progress Notes (Signed)
   PRENATAL VISIT NOTE  Subjective:  Megan Nolan is a 38 y.o. [phone] at [redacted]w[redacted]d being seen today for ongoing prenatal care.  She is currently monitored for the following issues for this low-risk pregnancy and has Nausea/vomiting in pregnancy; Encounter for supervision of normal pregnancy in multigravida, antepartum; AMA (advanced maternal age) multigravida 35+; Fetal echogenic intracardiac focus of the heart (EIF) on anatomy scan; and Group B Streptococcus carrier, +RV culture, currently pregnant on their problem list.  Patient reports occasional contractions, wants check.  Contractions: Not present. Vag. Bleeding: None.  Movement: Present. Denies leaking of fluid.   The following portions of the patient's history were reviewed and updated as appropriate: allergies, current medications, past family history, past medical history, past social history, past surgical history and problem list.   Objective:    Vitals:   08/09/24 1031  BP: 123/79  Pulse: 88  Weight: 174 lb (78.9 kg)    Fetal Status:  Fetal Heart Rate (bpm): 142 Fundal Height: 39 cm Movement: Present Presentation: Vertex  General: Alert, oriented and cooperative. Patient is in no acute distress.  Skin: Skin is warm and dry. No rash noted.   Cardiovascular: Normal heart rate noted  Respiratory: Normal respiratory effort, no problems with respiration noted  Abdomen: Soft, gravid, appropriate for gestational age.  Pain/Pressure: Present     Pelvic: Cervical exam performed in the presence of a chaperone Dilation: 1 Effacement (%): 50 Station: -2  Extremities: Normal range of motion.  Edema: None  Mental Status: Normal mood and affect. Normal behavior. Normal judgment and thought content.   Assessment and Plan:  Pregnancy: G4P1021 at [redacted]w[redacted]d 1. Multigravida of advanced maternal age in third trimester 2. [redacted] weeks gestation of pregnancy 3. Encounter for supervision of normal pregnancy in multigravida, antepartum  (Primary) Discussed IOL at 41 weeks to maximize chances of spontaneous labor, also talked about need for post term BPP.  All questions answered.  Induction of labor scheduled on 08/22/2024 daytime, orders have been signed and held.  She was told to expect a call from [facility] with further instructions about the induction of labor. She was told that she will be called in during the morning between 6:30am -9 am to come in for her induction as soon as the rooms and staff are ready for her.  Term labor symptoms and general obstetric precautions including but not limited to vaginal bleeding, contractions, leaking of fluid and fetal movement were reviewed in detail with the patient. Please refer to After Visit Summary for other counseling recommendations.   Return in about 1 week (around 08/16/2024) for NST, BPP, OFFICE OB VISIT (MD only) - schedule with [place] and MD for next week.  Future Appointments  Date Time Provider Department Center  08/15/2024  9:35 AM Emilio Delilah CHRISTELLA, CNM [facility]  08/22/2024  7:15 AM [facility] SCHED ROOM [facility] None     Gloris Hugger, MD

## 2024-08-15 ENCOUNTER — Ambulatory Visit: Payer: Self-pay | Admitting: Certified Nurse Midwife

## 2024-08-15 VITALS — BP 125/80 | HR 67 | Wt 172.0 lb

## 2024-08-15 DIAGNOSIS — O09523 Supervision of elderly multigravida, third trimester: Secondary | ICD-10-CM

## 2024-08-15 DIAGNOSIS — O0993 Supervision of high risk pregnancy, unspecified, third trimester: Secondary | ICD-10-CM

## 2024-08-15 DIAGNOSIS — B951 Streptococcus, group B, as the cause of diseases classified elsewhere: Secondary | ICD-10-CM

## 2024-08-15 DIAGNOSIS — Z3A40 40 weeks gestation of pregnancy: Secondary | ICD-10-CM

## 2024-08-15 DIAGNOSIS — O480 Post-term pregnancy: Secondary | ICD-10-CM

## 2024-08-15 DIAGNOSIS — Z3A39 39 weeks gestation of pregnancy: Secondary | ICD-10-CM

## 2024-08-15 NOTE — Progress Notes (Signed)
   PRENATAL VISIT NOTE  Subjective:  Megan Nolan is a 38 y.o. [phone] at [redacted]w[redacted]d being seen today for ongoing prenatal care.  She is currently monitored for the following issues for this high-risk pregnancy and has Nausea/vomiting in pregnancy; Encounter for supervision of normal pregnancy in multigravida, antepartum; AMA (advanced maternal age) multigravida 35+; Fetal echogenic intracardiac focus of the heart (EIF) on anatomy scan; and Group B Streptococcus carrier, +RV culture, currently pregnant on their problem list.  Patient reports no complaints.  Contractions: Irritability. Vag. Bleeding: None.  Movement: Present. Denies leaking of fluid.   The following portions of the patient's history were reviewed and updated as appropriate: allergies, current medications, past family history, past medical history, past social history, past surgical history and problem list.   Objective:    Vitals:   08/15/24 0935  BP: 125/80  Pulse: 67  Weight: 172 lb (78 kg)    Fetal Status:  Fetal Heart Rate (bpm): 145   Movement: Present Presentation: Vertex  General: Alert, oriented and cooperative. Patient is in no acute distress.  Skin: Skin is warm and dry. No rash noted.   Cardiovascular: Normal heart rate noted  Respiratory: Normal respiratory effort, no problems with respiration noted  Abdomen: Soft, gravid, appropriate for gestational age.  Pain/Pressure: Present     Pelvic: Cervical exam performed in the presence of a chaperone Dilation: 2 Effacement (%): 60 Station: -1 Harlene Gander, CMA  Extremities: Normal range of motion.     Mental Status: Normal mood and affect. Normal behavior. Normal judgment and thought content.   Assessment and Plan:  Pregnancy: G4P1021 at [redacted]w[redacted]d 1. Supervision of high risk pregnancy in third trimester (Primary) - Patient doing well.  - Reports frequent and vigorous fetal movement.   2. Multigravida of advanced maternal age in third trimester - Fetal  nonstress test  3. [redacted] weeks gestation of pregnancy - Fetal nonstress test  125bpm with moderate variability 15x15 accels no decels  Reactive and Reassuring. Offered Membrane sweep and patient declined.   4. Positive GBS test - Plan for treatment in labor.  - IOL scheduled for 10/8    Term labor symptoms and general obstetric precautions including but not limited to vaginal bleeding, contractions, leaking of fluid and fetal movement were reviewed in detail with the patient. Please refer to After Visit Summary for other counseling recommendations.   Return in about 1 week (around 08/22/2024) for LOB.  Future Appointments  Date Time Provider Department Center  08/21/2024  3:30 PM Herchel Gloris LABOR, MD [facility]  08/22/2024  7:15 AM [facility] SCHED ROOM [facility] None    Jillianna Stanek Erven) Emilio, MSN, CNM  Center for [facility]  08/15/2024 12:07 PM

## 2024-08-16 ENCOUNTER — Other Ambulatory Visit: Payer: Self-pay

## 2024-08-16 ENCOUNTER — Inpatient Hospital Stay (HOSPITAL_COMMUNITY)
Admission: AD | Admit: 2024-08-16 | Discharge: 2024-08-16 | Disposition: A | Discharge status: Home / Self Care | Payer: Self-pay | Attending: Family Medicine | Admitting: Family Medicine

## 2024-08-16 ENCOUNTER — Inpatient Hospital Stay (HOSPITAL_COMMUNITY): Payer: Self-pay | Admitting: Anesthesiology

## 2024-08-16 ENCOUNTER — Encounter (HOSPITAL_COMMUNITY): Payer: Self-pay | Admitting: Obstetrics and Gynecology

## 2024-08-16 ENCOUNTER — Encounter (HOSPITAL_COMMUNITY): Payer: Self-pay | Admitting: Obstetrics & Gynecology

## 2024-08-16 ENCOUNTER — Inpatient Hospital Stay (HOSPITAL_COMMUNITY)
Admission: AD | Admit: 2024-08-16 | Discharge: 2024-08-19 | DRG: 807 | Disposition: A | Discharge status: Home / Self Care | Payer: Self-pay | Attending: Obstetrics and Gynecology | Admitting: Obstetrics and Gynecology

## 2024-08-16 DIAGNOSIS — O9962 Diseases of the digestive system complicating childbirth: Secondary | ICD-10-CM | POA: Diagnosis present

## 2024-08-16 DIAGNOSIS — O9902 Anemia complicating childbirth: Secondary | ICD-10-CM | POA: Diagnosis present

## 2024-08-16 DIAGNOSIS — Z3A40 40 weeks gestation of pregnancy: Secondary | ICD-10-CM | POA: Diagnosis not present

## 2024-08-16 DIAGNOSIS — O99824 Streptococcus B carrier state complicating childbirth: Secondary | ICD-10-CM | POA: Diagnosis present

## 2024-08-16 DIAGNOSIS — Z3480 Encounter for supervision of other normal pregnancy, unspecified trimester: Principal | ICD-10-CM

## 2024-08-16 DIAGNOSIS — K219 Gastro-esophageal reflux disease without esophagitis: Secondary | ICD-10-CM | POA: Diagnosis present

## 2024-08-16 DIAGNOSIS — Z87891 Personal history of nicotine dependence: Secondary | ICD-10-CM | POA: Diagnosis not present

## 2024-08-16 DIAGNOSIS — O479 False labor, unspecified: Secondary | ICD-10-CM

## 2024-08-16 DIAGNOSIS — O26893 Other specified pregnancy related conditions, third trimester: Secondary | ICD-10-CM | POA: Diagnosis present

## 2024-08-16 DIAGNOSIS — O471 False labor at or after 37 completed weeks of gestation: Secondary | ICD-10-CM | POA: Insufficient documentation

## 2024-08-16 LAB — POCT FERN TEST
POCT Fern Test: NEGATIVE
POCT Fern Test: POSITIVE

## 2024-08-16 LAB — CBC
HCT: 34.7 % — ABNORMAL LOW (ref 36.0–46.0)
Hemoglobin: 12.1 g/dL (ref 12.0–15.0)
MCH: 35.2 pg — ABNORMAL HIGH (ref 26.0–34.0)
MCHC: 34.9 g/dL (ref 30.0–36.0)
MCV: 100.9 fL — ABNORMAL HIGH (ref 80.0–100.0)
Platelets: 272 K/uL (ref 150–400)
RBC: 3.44 MIL/uL — ABNORMAL LOW (ref 3.87–5.11)
RDW: 12.3 % (ref 11.5–15.5)
WBC: 18.7 K/uL — ABNORMAL HIGH (ref 4.0–10.5)
nRBC: 0 % (ref 0.0–0.2)

## 2024-08-16 LAB — RUPTURE OF MEMBRANE (ROM)PLUS: Rom Plus: NEGATIVE

## 2024-08-16 LAB — TYPE AND SCREEN
ABO/RH(D): O POS
Antibody Screen: NEGATIVE

## 2024-08-16 MED ORDER — OXYTOCIN-SODIUM CHLORIDE 30-0.9 UT/500ML-% IV SOLN
2.5000 [IU]/h | INTRAVENOUS | Status: DC
  Administered 2024-08-17: 2.5 [IU]/h via INTRAVENOUS

## 2024-08-16 MED ORDER — ACETAMINOPHEN 325 MG PO TABS: 650.0000 mg | ORAL_TABLET | ORAL | Status: DC | PRN

## 2024-08-16 MED ORDER — FENTANYL CITRATE (PF) 100 MCG/2ML IJ SOLN
100.0000 ug | Freq: Once | INTRAMUSCULAR | Status: AC
  Administered 2024-08-16: 100 ug via INTRAMUSCULAR
  Filled 2024-08-16: qty 2

## 2024-08-16 MED ORDER — CYCLOBENZAPRINE HCL 5 MG PO TABS
10.0000 mg | ORAL_TABLET | Freq: Once | ORAL | Status: AC
  Administered 2024-08-16: 10 mg via ORAL
  Filled 2024-08-16: qty 2

## 2024-08-16 MED ORDER — FENTANYL CITRATE (PF) 100 MCG/2ML IJ SOLN: 50.0000 ug | INTRAMUSCULAR | Status: DC | PRN

## 2024-08-16 MED ORDER — LACTATED RINGERS IV SOLN: INTRAVENOUS | Status: DC

## 2024-08-16 MED ORDER — LIDOCAINE HCL (PF) 1 % IJ SOLN: 30.0000 mL | INTRAMUSCULAR | Status: DC | PRN

## 2024-08-16 MED ORDER — FLEET ENEMA RE ENEM: 1.0000 | ENEMA | Freq: Every day | RECTAL | Status: DC | PRN

## 2024-08-16 MED ORDER — PHENYLEPHRINE 80 MCG/ML (10ML) SYRINGE FOR IV PUSH (FOR BLOOD PRESSURE SUPPORT): 80.0000 ug | PREFILLED_SYRINGE | INTRAVENOUS | Status: DC | PRN

## 2024-08-16 MED ORDER — OXYCODONE-ACETAMINOPHEN 5-325 MG PO TABS: 2.0000 | ORAL_TABLET | ORAL | Status: DC | PRN

## 2024-08-16 MED ORDER — SOD CITRATE-CITRIC ACID 500-334 MG/5ML PO SOLN: 30.0000 mL | ORAL | Status: DC | PRN

## 2024-08-16 MED ORDER — OXYCODONE-ACETAMINOPHEN 5-325 MG PO TABS: 1.0000 | ORAL_TABLET | ORAL | Status: DC | PRN

## 2024-08-16 MED ORDER — LACTATED RINGERS IV SOLN
500.0000 mL | Freq: Once | INTRAVENOUS | Status: AC
  Administered 2024-08-17: 500 mL via INTRAVENOUS

## 2024-08-16 MED ORDER — CYCLOBENZAPRINE HCL 10 MG PO TABS: 10.0000 mg | ORAL_TABLET | Freq: Two times a day (BID) | ORAL | 0 refills | Status: DC | PRN

## 2024-08-16 MED ORDER — DIPHENHYDRAMINE HCL 50 MG/ML IJ SOLN: 12.5000 mg | INTRAMUSCULAR | Status: DC | PRN

## 2024-08-16 MED ORDER — OXYTOCIN BOLUS FROM INFUSION
333.0000 mL | Freq: Once | INTRAVENOUS | Status: AC
  Administered 2024-08-17: 333 mL via INTRAVENOUS

## 2024-08-16 MED ORDER — EPHEDRINE 5 MG/ML INJ: 10.0000 mg | INTRAVENOUS | Status: DC | PRN

## 2024-08-16 MED ORDER — ONDANSETRON HCL 4 MG/2ML IJ SOLN
4.0000 mg | Freq: Four times a day (QID) | INTRAMUSCULAR | Status: DC | PRN
  Administered 2024-08-17: 4 mg via INTRAVENOUS
  Filled 2024-08-16: qty 2

## 2024-08-16 MED ORDER — HYDROXYZINE HCL 50 MG PO TABS: 50.0000 mg | ORAL_TABLET | Freq: Four times a day (QID) | ORAL | Status: DC | PRN

## 2024-08-16 MED ORDER — LACTATED RINGERS IV SOLN
500.0000 mL | INTRAVENOUS | Status: DC | PRN
  Administered 2024-08-16: 1000 mL via INTRAVENOUS

## 2024-08-16 MED ORDER — FENTANYL-BUPIVACAINE-NACL 0.5-0.125-0.9 MG/250ML-% EP SOLN
12.0000 mL/h | EPIDURAL | Status: DC | PRN
  Administered 2024-08-17: 12 mL/h via EPIDURAL
  Filled 2024-08-16: qty 250

## 2024-08-16 MED ORDER — PENICILLIN G POT IN DEXTROSE 60000 UNIT/ML IV SOLN
3.0000 10*6.[IU] | INTRAVENOUS | Status: DC
  Administered 2024-08-17 (×3): 3 10*6.[IU] via INTRAVENOUS
  Filled 2024-08-16 (×3): qty 50

## 2024-08-16 MED ORDER — SODIUM CHLORIDE 0.9 % IV SOLN
5.0000 10*6.[IU] | Freq: Once | INTRAVENOUS | Status: AC
  Administered 2024-08-17: 5 10*6.[IU] via INTRAVENOUS
  Filled 2024-08-16 (×2): qty 5

## 2024-08-16 NOTE — MAU Provider Note (Signed)
 [name] is a [id number] at [redacted]w[redacted]d seen in MAU for labor. RN labor check, not seen by provider. SVE by RN Dilation: 2 Effacement (%): 70 Station: -3 Exam by:: Nat Sharps RN   NST - FHR: 130 bpm / moderate variability / accels present / decels absent / TOCO: regular every 5-7 mins   Plan:  D/C home with labor precautions Keep scheduled appt with [facility] provider on 10/7  Norleen LULLA Rover, MD  08/16/2024 9:07 AM

## 2024-08-16 NOTE — H&P (Addendum)
 Megan Nolan is a 38 y.o. female [id number] with IUP at [redacted]w[redacted]d by 8 week US  presenting for early labor, SROM at 2200 in MAU .  She reports positive fetal movement. She denies vaginal bleeding.  Prenatal History/Complications:  Term SVD in 2008 Ectopic 12/23   PNC at [facility]   Past Medical History: Past Medical History:  Diagnosis Date   Abnormal Pap smear    Medical history non-contributory     Past Surgical History: Past Surgical History:  Procedure Laterality Date   CERVIX LESION DESTRUCTION     DIAGNOSTIC LAPAROSCOPY WITH REMOVAL OF ECTOPIC PREGNANCY N/A 11/08/2022   Procedure: DIAGNOSTIC LAPAROSCOPY WITH REMOVAL OF ECTOPIC PREGNANCY;  Surgeon: Jayne Vonn DEL, MD;  Location: [facility] OR;  Service: Gynecology;  Laterality: N/A;   WISDOM TOOTH EXTRACTION      Obstetrical History: OB History     Gravida  4   Para  1   Term  1   Preterm      AB  2   Living  1      SAB  0   IAB  1   Ectopic  1   Multiple      Live Births  1            Social History: Social History   Socioeconomic History   Marital status: Significant Other    Spouse name: Not on file   Number of children: 1   Years of education: Not on file   Highest education level: Not on file  Occupational History   Not on file  Tobacco Use   Smoking status: Former    Current packs/day: 0.00    Types: Cigarettes    Quit date: 04/01/2012    Years since quitting: 12.3   Smokeless tobacco: Never  Vaping Use   Vaping status: Never Used  Substance and Sexual Activity   Alcohol use: Not Currently    Comment: occasionally   Drug use: Not Currently    Types: Marijuana   Sexual activity: Yes    Birth control/protection: None  Other Topics Concern   Not on file  Social History Narrative   Not on file   Social Drivers of Health   Financial Resource Strain: Not on file  Food Insecurity: No Food Insecurity (08/16/2024)   Hunger Vital Sign    Worried About Running Out of Food in the Last  Year: Never true    Ran Out of Food in the Last Year: Never true  Transportation Needs: No Transportation Needs (08/16/2024)   PRAPARE - Administrator, Civil Service (Medical): No    Lack of Transportation (Non-Medical): No  Physical Activity: Not on file  Stress: Not on file (09/25/2023)  Social Connections: Not on file    Family History: Family History  Problem Relation Age of Onset   Cancer Maternal Grandmother    Cancer Paternal Grandmother     Allergies: No Known Allergies  Medications Prior to Admission  Medication Sig Dispense Refill Last Dose/Taking   aspirin  EC 81 MG tablet Take 1 tablet (81 mg total) by mouth at bedtime. Start taking when you are [redacted] weeks pregnant for rest of pregnancy for prevention of preeclampsia 300 tablet 2    cyclobenzaprine  (FLEXERIL ) 10 MG tablet Take 1 tablet (10 mg total) by mouth 2 (two) times daily as needed for muscle spasms. 5 tablet 0    hydrOXYzine  (ATARAX ) 25 MG tablet Take 1 tablet (25 mg total) by mouth  at bedtime as needed (insomia). 30 tablet 2    ondansetron  (ZOFRAN -ODT) 4 MG disintegrating tablet Take 1 tablet (4 mg total) by mouth every 6 (six) hours as needed for nausea. 20 tablet 0    pantoprazole  (PROTONIX ) 20 MG tablet Take 1 tablet (20 mg total) by mouth daily. 30 tablet 3    Prenatal 28-0.8 MG TABS Take 1 tablet by mouth daily. 30 tablet 12     Review of Systems   Constitutional: Negative for fever and chills Eyes: Negative for visual disturbances Respiratory: Negative for shortness of breath, dyspnea Cardiovascular: Negative for chest pain or palpitations  Gastrointestinal: Negative for vomiting, diarrhea and constipation.  POSITIVE for abdominal pain (contractions) Genitourinary: Negative for dysuria and urgency Musculoskeletal: Negative for back pain, joint pain, myalgias  Neurological: Negative for dizziness and headaches  Blood pressure (!) 150/96, pulse 96, temperature 97.6 F (36.4 C), temperature  source Oral, resp. rate 17, height 5' 6.5 (1.689 m), weight 76.7 kg, SpO2 100%. General appearance: alert, cooperative, and no distress Lungs: normal respiratory effort Heart: regular rate and rhythm Abdomen: soft, non-tender; bowel sounds normal Extremities: Homans sign is negative, no sign of DVT DTR's 2+ Presentation: cephalic Fetal monitoring  Baseline: 140 bpm, Variability: Good {> 6 bpm), Accelerations: Reactive, and Decelerations: Absent Uterine activity  3-5 minutes Dilation: 3 Effacement (%): 90 Station: -2 Exam by:: [name], RN   Prenatal labs: ABO, Rh: --/--/O POS (10/02 2247) Antibody: NEG (10/02 2247) Rubella: 4.30 (03/11 1345) RPR: Non Reactive (07/10 0921)  HBsAg: Negative (03/11 1345)  HIV: Non Reactive (07/10 0921)  GBS: Positive/-- (09/04 1000)   NURSING  PROVIDER  Office Location [facility] Dating by U/S at 8 wks  [facility] Model Traditional Anatomy U/S Normal except EIF  Initiated care at  [facility]  English              LAB RESULTS   Support Person  Genetics NIPS: LR M AFP:     Carrier Screen Horizon: neg  Rhogam  O/Positive/-- (03/11 1345) A1C/GTT Early HgbA1C: 4.3 Third trimester 2 hr GTT: 90/93/103  Flu Vaccine     TDaP Vaccine   Blood Type O/Positive/-- (03/11 1345)  RSV Vaccine  Antibody Negative (03/11 1345)  COVID Vaccine  Rubella 4.30 (03/11 1345)  Feeding Plan breast RPR Non Reactive (03/11 1345)  Contraception unsure HBsAg Negative (03/11 1345)  Circumcision yes HIV Non Reactive (03/11 1345)  Pediatrician  unsure HCVAb Non Reactive (03/11 1345)  Prenatal Classes     BTL Consent  Pap Diagnosis  Date Value Ref Range Status  03/17/2023   Final   - Negative for intraepithelial lesion or malignancy (NILM)    BTL Pre-payment  GC/CT Initial:  neg/neg 36wks:    VBAC Consent  GBS  POSITIVE  BRx Optimized? [ ]  yes   [ ]  no    DME Rx [ ]  BP cuff [ ]  Weight Scale Waterbirth  [ ]  Class [ ]  Consent [ ]  CNM visit  PHQ9 &  GAD7 [  ] new OB [  ] 28 weeks  [  ] 36 weeks Induction  [ ]  Orders Entered [ ] Foley Y/N       Prenatal Transfer Tool  Maternal Diabetes: No Genetic Screening: Normal Maternal Ultrasounds/Referrals: Normal Fetal Ultrasounds or other Referrals:  None Maternal Substance Abuse:  No Significant Maternal Medications:  None Significant Maternal Lab Results: Group  B Strep positive  Results for orders placed or performed during the hospital encounter of 08/16/24 (from the past 24 hours)  Rupture of Membrane (ROM) Plus   Collection Time: 08/16/24  8:58 PM  Result Value Ref Range   Rom Plus NEGATIVE   POCT fern test   Collection Time: 08/16/24  8:58 PM  Result Value Ref Range   POCT Fern Test Negative = intact amniotic membranes   Fern Test   Collection Time: 08/16/24 10:25 PM  Result Value Ref Range   POCT Fern Test Positive = ruptured amniotic membanes   CBC   Collection Time: 08/16/24 10:47 PM  Result Value Ref Range   WBC 18.7 (H) 4.0 - 10.5 K/uL   RBC 3.44 (L) 3.87 - 5.11 MIL/uL   Hemoglobin 12.1 12.0 - 15.0 g/dL   HCT 65.2 (L) 63.9 - 53.9 %   MCV 100.9 (H) 80.0 - 100.0 fL   MCH 35.2 (H) 26.0 - 34.0 pg   MCHC 34.9 30.0 - 36.0 g/dL   RDW 87.6 88.4 - 84.4 %   Platelets 272 150 - 400 K/uL   nRBC 0.0 0.0 - 0.2 %  Type and screen   Collection Time: 08/16/24 10:47 PM  Result Value Ref Range   ABO/RH(D) O POS    Antibody Screen NEG    Sample Expiration      08/19/2024,2359 Performed at [facility], [address], [place], [place] [address]     Assessment: BEDELIA PONG is a 38 y.o. [phone] with an IUP at [redacted]w[redacted]d presenting for ROM/early labor  Plan: #Labor: expectant management #Pain:  Per request #FWB Cat 1 #ID: GBS: +, PCN  #MOF:  breast #MOC: unsure #Circ: yes   Cathlean Ely 08/16/2024, 11:34 PM

## 2024-08-16 NOTE — Anesthesia Preprocedure Evaluation (Signed)
 Anesthesia Evaluation  Patient identified by MRN, date of birth, ID band Patient awake    Reviewed: Allergy & Precautions, Patient's Chart, lab work & pertinent test results  Airway Mallampati: II       Dental no notable dental hx.    Pulmonary Patient abstained from smoking., former smoker   Pulmonary exam normal        Cardiovascular negative cardio ROS Normal cardiovascular exam Rhythm:Regular     Neuro/Psych negative neurological ROS  negative psych ROS   GI/Hepatic Neg liver ROS,GERD  ,,  Endo/Other  negative endocrine ROS    Renal/GU negative Renal ROS  negative genitourinary   Musculoskeletal negative musculoskeletal ROS (+)    Abdominal Normal abdominal exam  (+)   Peds  Hematology  (+) Blood dyscrasia, anemia   Anesthesia Other Findings   Reproductive/Obstetrics (+) Pregnancy                              Anesthesia Physical Anesthesia Plan  ASA: 2  Anesthesia Plan: Epidural   Post-op Pain Management:    Induction: Intravenous  PONV Risk Score and Plan: Treatment may vary due to age or medical condition  Airway Management Planned: Natural Airway  Additional Equipment:   Intra-op Plan:   Post-operative Plan:   Informed Consent: I have reviewed the patients History and Physical, chart, labs and discussed the procedure including the risks, benefits and alternatives for the proposed anesthesia with the patient or authorized representative who has indicated his/her understanding and acceptance.       Plan Discussed with: Anesthesiologist  Anesthesia Plan Comments:          Anesthesia Quick Evaluation

## 2024-08-16 NOTE — MAU Provider Note (Addendum)
 [name] is a [id number] at [redacted]w[redacted]d seen in MAU for labor. RN labor check, not seen by provider.   SVE by RN Dilation: 3 Effacement (%): 90 Station: -2 Exam by:: Megan Mule, RN   [name], RN reports patient request for pain medication>>order placed for Fentanyl  100 mcg IM  Reassessment after pain medication>>  NST - FHR: 140 bpm / moderate variability / accels present / decels absent / TOCO: irregular UCs   Report given to and care assumed by Dr. CHARLENA Nolan @ [address], CNM  08/16/2024 9:40 PM   Notified by RN, hx of one episode leaking fluid. Fern Negative and ROM+ negative  Plan: D/C home with labor precautions Keep scheduled appt with [facility] on 08/21/2024  Megan KATHEE Pouch, MD  08/17/24

## 2024-08-16 NOTE — MAU Note (Signed)
 Megan Nolan is a 38 y.o. at [redacted]w[redacted]d here in MAU reporting: contractions since yesterday. States she was here this am for labor eval and was 2-3cm. Ctxs continue and had gush fld 4-5 hours ago just one time. Reports good FM and no VB  LMP: na Onset of complaint: yesterday Pain score: 10 Vitals:   08/16/24 2022  Pulse: 77  Resp: 18  Temp: 97.8 F (36.6 C)  SpO2: 100%     FHT: 140  Lab orders placed from triage: labor eval

## 2024-08-16 NOTE — MAU Note (Signed)
 MACKENNA KAMER is a 38 y.o. at [redacted]w[redacted]d here in MAU reporting: started contracting q at 0200.  Now   q5-4min.  No bleeding or water  leaking.   Had some pinkish mucous.  Denies any problems with preg.  Onset of complaint: 0200 Pain score: severe There were no vitals filed for this visit.   QYU:ipmzru to rm, reports +FM Lab orders placed from triage:

## 2024-08-17 ENCOUNTER — Encounter (HOSPITAL_COMMUNITY): Payer: Self-pay | Admitting: Obstetrics & Gynecology

## 2024-08-17 ENCOUNTER — Encounter (HOSPITAL_COMMUNITY): Payer: Self-pay

## 2024-08-17 DIAGNOSIS — O480 Post-term pregnancy: Secondary | ICD-10-CM

## 2024-08-17 DIAGNOSIS — M5126 Other intervertebral disc displacement, lumbar region: Secondary | ICD-10-CM | POA: Insufficient documentation

## 2024-08-17 DIAGNOSIS — Z3A40 40 weeks gestation of pregnancy: Secondary | ICD-10-CM

## 2024-08-17 DIAGNOSIS — O99820 Streptococcus B carrier state complicating pregnancy: Secondary | ICD-10-CM

## 2024-08-17 DIAGNOSIS — O4202 Full-term premature rupture of membranes, onset of labor within 24 hours of rupture: Secondary | ICD-10-CM

## 2024-08-17 LAB — RPR: RPR Ser Ql: NONREACTIVE

## 2024-08-17 MED ORDER — ZOLPIDEM TARTRATE 5 MG PO TABS: 5.0000 mg | ORAL_TABLET | Freq: Every evening | ORAL | Status: DC | PRN

## 2024-08-17 MED ORDER — DIPHENHYDRAMINE HCL 25 MG PO CAPS
25.0000 mg | ORAL_CAPSULE | Freq: Four times a day (QID) | ORAL | Status: DC | PRN
  Administered 2024-08-18: 25 mg via ORAL
  Filled 2024-08-17: qty 1

## 2024-08-17 MED ORDER — LIDOCAINE HCL (PF) 1 % IJ SOLN
INTRAMUSCULAR | Status: DC | PRN
  Administered 2024-08-16 (×2): 5 mL via EPIDURAL

## 2024-08-17 MED ORDER — SENNOSIDES-DOCUSATE SODIUM 8.6-50 MG PO TABS
2.0000 | ORAL_TABLET | Freq: Every day | ORAL | Status: DC
  Administered 2024-08-18: 2 via ORAL
  Filled 2024-08-17: qty 2

## 2024-08-17 MED ORDER — OXYTOCIN-SODIUM CHLORIDE 30-0.9 UT/500ML-% IV SOLN
1.0000 m[IU]/min | INTRAVENOUS | Status: DC
  Administered 2024-08-17: 2 m[IU]/min via INTRAVENOUS
  Filled 2024-08-17: qty 500

## 2024-08-17 MED ORDER — OXYCODONE HCL 5 MG PO TABS: 5.0000 mg | ORAL_TABLET | ORAL | Status: DC | PRN

## 2024-08-17 MED ORDER — WITCH HAZEL-GLYCERIN EX PADS: 1.0000 | MEDICATED_PAD | CUTANEOUS | Status: DC | PRN

## 2024-08-17 MED ORDER — ONDANSETRON HCL 4 MG PO TABS: 4.0000 mg | ORAL_TABLET | ORAL | Status: DC | PRN

## 2024-08-17 MED ORDER — TETANUS-DIPHTH-ACELL PERTUSSIS 5-2-15.5 LF-MCG/0.5 IM SUSP: 0.5000 mL | Freq: Once | INTRAMUSCULAR | Status: DC

## 2024-08-17 MED ORDER — ONDANSETRON HCL 4 MG/2ML IJ SOLN: 4.0000 mg | INTRAMUSCULAR | Status: DC | PRN

## 2024-08-17 MED ORDER — COCONUT OIL OIL: 1.0000 | TOPICAL_OIL | Status: DC | PRN

## 2024-08-17 MED ORDER — SIMETHICONE 80 MG PO CHEW: 80.0000 mg | CHEWABLE_TABLET | ORAL | Status: DC | PRN

## 2024-08-17 MED ORDER — BENZOCAINE-MENTHOL 20-0.5 % EX AERO
1.0000 | INHALATION_SPRAY | CUTANEOUS | Status: DC | PRN
  Administered 2024-08-17: 1 via TOPICAL
  Filled 2024-08-17: qty 56

## 2024-08-17 MED ORDER — IBUPROFEN 600 MG PO TABS
600.0000 mg | ORAL_TABLET | Freq: Four times a day (QID) | ORAL | Status: DC
  Administered 2024-08-17 – 2024-08-19 (×7): 600 mg via ORAL
  Filled 2024-08-17 (×8): qty 1

## 2024-08-17 MED ORDER — DIBUCAINE (PERIANAL) 1 % EX OINT: 1.0000 | TOPICAL_OINTMENT | CUTANEOUS | Status: DC | PRN

## 2024-08-17 MED ORDER — PRENATAL MULTIVITAMIN CH
1.0000 | ORAL_TABLET | Freq: Every day | ORAL | Status: DC
Start: 2024-08-18 — End: 2024-08-20
  Administered 2024-08-18 – 2024-08-19 (×2): 1 via ORAL
  Filled 2024-08-17 (×2): qty 1

## 2024-08-17 MED ORDER — ACETAMINOPHEN 500 MG PO TABS: 1000.0000 mg | ORAL_TABLET | Freq: Four times a day (QID) | ORAL | Status: DC | PRN

## 2024-08-17 MED ORDER — TERBUTALINE SULFATE 1 MG/ML IJ SOLN: 0.2500 mg | Freq: Once | INTRAMUSCULAR | Status: DC | PRN

## 2024-08-17 NOTE — Progress Notes (Signed)
 Patient ID: Megan Nolan, female   DOB: 03-Oct-1986, 38 y.o.   MRN: [id number]  Labor Progress Note Megan Nolan is a 38 y.o. [phone] at [redacted]w[redacted]d presented for SOL with SROM at 2200 08/16/24  S:  Pt resting in bed, more comfortable with epidural.   O:  BP 135/82   Pulse 72   Temp 98.1 F (36.7 C) (Oral)   Resp 19   Ht 5' 6.5 (1.689 m)   Wt 169 lb (76.7 kg)   LMP  (LMP Unknown)   SpO2 100%   BMI 26.87 kg/m  EFM: baseline 145 bpm/ moderate variability/ 15x15 accels/ no decels  Toco/IUPC: q4-20min, do not palpate strong SVE: Dilation: 4 Effacement (%): 90 Cervical Position: Posterior Station: -1 Presentation: Vertex Exam by:: Cornell Finder, CNM Pitocin: to start  A/P: 38 y.o. [id number] [redacted]w[redacted]d  1. Labor: Latent labor, will begin pitocin titration to facilitate dilation/descent. Also encouraged position changes. 2. FWB: Cat 1 3. Pain: Well controlled with epidural, instructed pt to let us  know if she starts feeling pressure.  Anticipate SVD.  Cornell JONELLE Finder, CNM 11:37 AM

## 2024-08-17 NOTE — Lactation Note (Signed)
 This note was copied from a baby's chart. Lactation Consultation Note  Patient Name: Megan Nolan Date: 08/17/2024 Age:37 hours Reason for consult: Initial assessment;Mother's request;Term;Breast augmentation;Breastfeeding assistance  P2- MOB breast fed her last child for 6 months. This older child is now 25 years old. MOB's feeding plan is to offer both breast milk and formula. MOB reports wanting to breastfeed for 2-3 months only. MOB has a hx of breast augmentation. With permission, LC assisted with placing infant on the right breast in the football hold. Infant was eager and latched within the first attempt. Infant had a strong rhythmic suck and nursed for 10 minutes. MOB felt like this was not enough, so MOB requested to hand express EBM. LC demonstrated how to do it and MOB was able to collect 0.5 mL on her own. Infant tolerated finger feeding well. LC set up the hospital DEBP per MOB request.  LC reviewed the first 24 hr birthday nap, day 2 cluster feeding, feeding infant on cue 8-12x in 24 hrs, not allowing infant to go over 3 hrs without a feeding, CDC milk storage guidelines, LC services handout and engorgement/breast care. LC encouraged MOB to call for further assistance as needed.  Maternal Data Has patient been taught Hand Expression?: Yes Does the patient have breastfeeding experience prior to this delivery?: Yes How long did the patient breastfeed?: 6 months  Feeding Mother's Current Feeding Choice: Breast Milk and Formula  LATCH Score Latch: Grasps breast easily, tongue down, lips flanged, rhythmical sucking.  Audible Swallowing: Spontaneous and intermittent  Type of Nipple: Everted at rest and after stimulation  Comfort (Breast/Nipple): Soft / non-tender  Hold (Positioning): Full assist, staff holds infant at breast  LATCH Score: 8   Lactation Tools Discussed/Used Tools: Pump;Flanges Flange Size: 21 Breast pump type: Double-Electric Breast  Pump;Manual Pump Education: Setup, frequency, and cleaning;Milk Storage Reason for Pumping: MOB request Pumping frequency: 15-20 min every 3 hrs  Interventions Interventions: Breast feeding basics reviewed;Assisted with latch;Hand express;Breast compression;Adjust position;Support pillows;Position options;Expressed milk;Hand pump;DEBP;Education;LC Services brochure  Discharge Discharge Education: Engorgement and breast care;Warning signs for feeding baby Pump: Manual  Consult Status Consult Status: Follow-up Date: 08/18/24 Follow-up type: In-patient    Recardo Hoit BS, IBCLC 08/17/2024, 6:29 PM

## 2024-08-17 NOTE — Discharge Summary (Signed)
 Postpartum Discharge Summary     Patient Name: Megan Nolan DOB: Feb 15, 1986 MRN: [id number]  Date of admission: 08/16/2024 Delivery date:08/17/2024 Delivering provider: VANNIE MATAR R Date of discharge: 08/19/2024  Admitting diagnosis: Indication for care in labor or delivery [O75.9] Intrauterine pregnancy: [redacted]w[redacted]d     Secondary diagnosis:  Principal Problem:   Indication for care in labor or delivery  Additional problems: None    Discharge diagnosis: Term Pregnancy Delivered                                              Post partum procedures:None Augmentation: Pitocin Complications: None  Hospital course: Onset of Labor With Vaginal Delivery      38 y.o. yo [id number] at [redacted]w[redacted]d was admitted in Latent Labor on 08/16/2024. Labor course was complicated by none.  Membrane Rupture Time/Date: 10:00 PM,08/16/2024  Delivery Method:Vaginal, Spontaneous Operative Delivery:N/A Episiotomy: None Lacerations:  None Patient had an uncomplicated postpartum course.  She is ambulating, tolerating a regular diet, passing flatus, and urinating well. Patient is discharged home in stable condition on 08/19/24.  Newborn Data: Birth date:08/17/2024 Birth time:2:17 PM Gender:Female Living status:Living Apgars:7 ,9  Weight:2750 g  Magnesium Sulfate received: No BMZ received: No Rhophylac:N/A MMR:N/A T-DaP:Given prenatally Flu: N/A RSV Vaccine received: No Transfusion:No  Immunizations received: Immunization History  Administered Date(s) Administered   Tdap 05/24/2024    Physical exam  Vitals:   08/18/24 0540 08/18/24 1610 08/18/24 2002 08/19/24 0505  BP: 129/74 128/76 129/81 113/78  Pulse: 90 73 83 63  Resp: 18 17 17 18   Temp: 98 F (36.7 C) 98.1 F (36.7 C) 98.4 F (36.9 C) 98.1 F (36.7 C)  TempSrc: Oral Oral Oral Oral  SpO2: 100% 100%    Weight:      Height:       General: alert, cooperative, and no distress Lochia: appropriate Uterine Fundus: firm Incision: N/A DVT  Evaluation: No evidence of DVT seen on physical exam. Labs: Lab Results  Component Value Date   WBC 18.7 (H) 08/16/2024   HGB 12.1 08/16/2024   HCT 34.7 (L) 08/16/2024   MCV 100.9 (H) 08/16/2024   PLT 272 08/16/2024      Latest Ref Rng & Units 01/24/2024    1:45 PM  CMP  Glucose 70 - 99 mg/dL 83   BUN 6 - 20 mg/dL 8   Creatinine 9.42 - 8.99 mg/dL 9.46   Sodium 865 - 855 mmol/L 136   Potassium 3.5 - 5.2 mmol/L 3.9   Chloride 96 - 106 mmol/L 104   CO2 20 - 29 mmol/L 20   Calcium 8.7 - 10.2 mg/dL 9.6   Total Protein 6.0 - 8.5 g/dL 6.9   Total Bilirubin 0.0 - 1.2 mg/dL 0.2   Alkaline Phos 44 - 121 IU/L 41   AST 0 - 40 IU/L 14   ALT 0 - 32 IU/L 10    Edinburgh Score:    08/18/2024   11:58 PM  Edinburgh Postnatal Depression Scale Screening Tool  I have been able to laugh and see the funny side of things. 0  I have looked forward with enjoyment to things. 0  I have blamed myself unnecessarily when things went wrong. 0  I have been anxious or worried for no good reason. 1  I have felt scared or panicky for no good reason. 0  Things have been  getting on top of me. 0  I have been so unhappy that I have had difficulty sleeping. 0  I have felt sad or miserable. 0  I have been so unhappy that I have been crying. 0  The thought of harming myself has occurred to me. 0  Edinburgh Postnatal Depression Scale Total 1   Edinburgh Postnatal Depression Scale Total: 1   After visit meds:  Allergies as of 08/19/2024   No Known Allergies      Medication List     STOP taking these medications    aspirin  EC 81 MG tablet   cyclobenzaprine  10 MG tablet Commonly known as: FLEXERIL    hydrOXYzine  25 MG tablet Commonly known as: ATARAX    ondansetron  4 MG disintegrating tablet Commonly known as: ZOFRAN -ODT   pantoprazole  20 MG tablet Commonly known as: Protonix        TAKE these medications    acetaminophen  500 MG tablet Commonly known as: TYLENOL  Take 2 tablets (1,000 mg  total) by mouth every 6 (six) hours as needed (for pain scale < 4).   ibuprofen  600 MG tablet Commonly known as: ADVIL  Take 1 tablet (600 mg total) by mouth every 6 (six) hours.   Prenatal 28-0.8 MG Tabs Take 1 tablet by mouth daily.   senna-docusate 8.6-50 MG tablet Commonly known as: Senokot-S Take 2 tablets by mouth daily.      Discharge home in stable condition Infant Feeding: Bottle and Breast Infant Disposition:home with mother Discharge instruction: per After Visit Summary and Postpartum booklet. Activity: Advance as tolerated. Pelvic rest for 6 weeks.  Diet: routine diet Future Appointments: Future Appointments  Date Time Provider Department Center  08/21/2024  3:30 PM Anyanwu, Gloris LABOR, MD [facility]   Follow up Visit: Message sent to [facility] on 08/19/24 by Cornell Finder, CNM Please schedule this patient for a In person postpartum visit in 6 weeks with the following provider: MD. Additional Postpartum F/U:1 week visit to sign tubal papers, should have Medicaid set up  Low risk pregnancy complicated by: N/A Delivery mode:  Vaginal, Spontaneous Anticipated Birth Control:  Plans Interval BTL   08/19/2024 Brittanni Cariker LITTIE Angles, MD

## 2024-08-17 NOTE — Anesthesia Procedure Notes (Signed)
 Epidural Patient location during procedure: OB Start time: 08/16/2024 11:53 PM End time: 08/17/2024 12:02 AM  Staffing Anesthesiologist: Jerrye Sharper, MD  Preanesthetic Checklist Completed: patient identified, IV checked, site marked, risks and benefits discussed, surgical consent, monitors and equipment checked, pre-op evaluation and timeout performed  Epidural Patient position: sitting Prep: DuraPrep and site prepped and draped Patient monitoring: continuous pulse ox and blood pressure Approach: midline Location: L4-L5 Injection technique: LOR air  Needle:  Needle type: Tuohy  Needle gauge: 17 G Needle length: 9 cm and 9 Needle insertion depth: 4 cm Catheter type: closed end flexible Catheter size: 19 Gauge Catheter at skin depth: 9 cm Test dose: negative and Other  Assessment Events: blood not aspirated, no cerebrospinal fluid, injection not painful, no injection resistance, no paresthesia and negative IV test  Additional Notes Patient identified. Risks and benefits discussed including failed block, incomplete  Pain control, post dural puncture headache, nerve damage, paralysis, blood pressure Changes, nausea, vomiting, reactions to medications-both toxic and allergic and post Partum back pain. All questions were answered. Patient expressed understanding and wished to proceed. Sterile technique was used throughout procedure. Epidural site was Dressed with sterile barrier dressing. No paresthesias, signs of intravascular injection Or signs of intrathecal spread were encountered.  Patient was more comfortable after the epidural was dosed. Please see RN's note for documentation of vital signs and FHR which are stable. Reason for block:procedure for pain

## 2024-08-18 NOTE — Discharge Instructions (Signed)
 [facility] Lactation Support Group  Please join us  for our [facility] [employer] Lactation Support Group at [employer] [facility] We meet every Tuesday at 10:00 am to 12:00 pm at [employer] on the second floor in the conference room Lactating parents and lap babies are welcome, no registration is required, if you have a lactation pillow please bring

## 2024-08-18 NOTE — Lactation Note (Addendum)
 This note was copied from a baby's chart. Lactation Consultation Note  Patient Name: Megan Nolan Unijb'd Date: 08/18/2024 Age:37 hours Reason for consult: Follow-up assessment;Term;Infant < 6lbs;Breast augmentation;RN request;Breastfeeding assistance < 6 lbs.  P2, < 6 lbs. Request by RN to assist with feeding stating baby has been crying, fussy and not latching to breast or bottle. Mother had breast augmentation in 2023.   LC noted with oral assessment that baby has short anterior lingual frenulum.    Mother states baby breastfed for 15 minutes at 0730 but she does not believe that he was on deep enough.  She states he did not latch on full nipple.  Placed pillows under baby for support to latch.  At breast, baby opened wide and after second attempt latched on nipple, lips flanged.  Mother felt strong pull. Baby had two sucking bursts and fatigued, stopped sucking.  Had mother compress breast and stimulated baby to feed but he no longer sucked.  While partially swaddled, moved baby to LC's lap to left side lying position. Baby fussy during feeding attempt.  With white Nfant nipple baby briefly suckled, no true swallows. Milk did spill out of side of his mouth.  LC ended feeding.  Assisted mother with pumping with DEBP fitted with 21 mm flange. Mother pumped 5 ml.  Reviewed milk storage.  Mother will try breastfeeding and supplement in 2 hours or sooner with feeding cues.   Maternal Data Has patient been taught Hand Expression?: Yes  Feeding Mother's Current Feeding Choice: Breast Milk and Formula  LATCH Score Latch: Repeated attempts needed to sustain latch, nipple held in mouth throughout feeding, stimulation needed to elicit sucking reflex.  Audible Swallowing: None  Type of Nipple: Everted at rest and after stimulation  Comfort (Breast/Nipple): Soft / non-tender  Hold (Positioning): Assistance needed to correctly position infant at breast and maintain latch.  LATCH  Score: 6   Lactation Tools Discussed/Used Tools: Pump;Flanges Flange Size: 21 Breast pump type: Double-Electric Breast Pump;Manual Reason for Pumping: stimulation and supplementation Pumping frequency: q 3 hours for 15 min  Interventions Interventions: Breast feeding basics reviewed;Assisted with latch;Hand express;Breast compression;Adjust position;Support pillows;DEBP;Education  Discharge Pump: Manual  Consult Status Consult Status: Follow-up Date: 08/19/24 Follow-up type: In-patient    Shannon Dines [facility] 08/18/2024, 12:16 PM

## 2024-08-18 NOTE — Progress Notes (Signed)
 POSTPARTUM PROGRESS NOTE  Post Partum Day #1  Subjective:  Megan Nolan is a 38 y.o. [id number] s/p SVD at [redacted]w[redacted]d.  She reports she is doing well. No acute events overnight. She denies any problems with ambulating, voiding or po intake. Denies nausea or vomiting.  Pain is well controlled.  Lochia is minimal.  Objective: Blood pressure 129/74, pulse 90, temperature 98 F (36.7 C), temperature source Oral, resp. rate 18, height 5' 6.5 (1.689 m), weight 76.7 kg, SpO2 100%, unknown if currently breastfeeding.  Physical Exam:  General: alert, cooperative and no distress Chest: no respiratory distress Heart:regular rate, distal pulses intact Abdomen: soft, nontender,  Uterine Fundus: firm, appropriately tender DVT Evaluation: No calf swelling or tenderness Extremities: No edema Skin: warm, dry  Recent Labs    08/16/24 2247  HGB 12.1  HCT 34.7*    Assessment/Plan: Megan Nolan is a 38 y.o. H5E7977 s/p SVD at [redacted]w[redacted]d   PPD#1 - Doing well  Routine postpartum care  Contraception: Depo + Interval BTL Feeding: Breast/Formula Dispo: Plan for discharge tomorrow.   LOS: 2 days   Barkley Angles, MD OB Fellow, Faculty Practice [facility], Center for [employer]

## 2024-08-18 NOTE — Anesthesia Postprocedure Evaluation (Signed)
 Anesthesia Post Note  Patient: Megan Nolan  Procedure(s) Performed: AN AD HOC LABOR EPIDURAL     Patient location during evaluation: Mother Baby Anesthesia Type: Epidural Level of consciousness: awake and alert Pain management: pain level controlled Vital Signs Assessment: post-procedure vital signs reviewed and stable Respiratory status: spontaneous breathing, nonlabored ventilation and respiratory function stable Cardiovascular status: stable Postop Assessment: no headache, no backache and epidural receding Anesthetic complications: no   No notable events documented.  Last Vitals:  Vitals:   08/18/24 0214 08/18/24 0540  BP: 104/65 129/74  Pulse: 79 90  Resp: 18 18  Temp: 36.7 C 36.7 C  SpO2: 100% 100%    Last Pain:  Vitals:   08/18/24 0540  TempSrc: Oral  PainSc:    Pain Goal: Patients Stated Pain Goal: 0 (08/16/24 2025)                 Greogory Cornette

## 2024-08-19 MED ORDER — SENNOSIDES-DOCUSATE SODIUM 8.6-50 MG PO TABS: 2.0000 | ORAL_TABLET | Freq: Every day | ORAL | 0 refills | Status: AC

## 2024-08-19 MED ORDER — ACETAMINOPHEN 500 MG PO TABS: 1000.0000 mg | ORAL_TABLET | Freq: Four times a day (QID) | ORAL | Status: AC | PRN

## 2024-08-19 MED ORDER — IBUPROFEN 600 MG PO TABS: 600.0000 mg | ORAL_TABLET | Freq: Four times a day (QID) | ORAL | 0 refills | Status: AC

## 2024-08-19 MED ORDER — MEDROXYPROGESTERONE ACETATE 150 MG/ML IM SUSP
150.0000 mg | Freq: Once | INTRAMUSCULAR | Status: AC
  Administered 2024-08-19: 150 mg via INTRAMUSCULAR
  Filled 2024-08-19: qty 1

## 2024-08-19 NOTE — Lactation Note (Addendum)
 This note was copied from a baby's chart. Lactation Consultation Note  Patient Name: Megan Nolan Unijb'd Date: 08/19/2024 Age:37 hours Reason for consult: Follow-up assessment;Term;Infant < 6lbs;Breast augmentation;Breastfeeding assistance  SLP consult put in for Monday.  P2, 7.6% weight loss. < 6 lbs. Mother had breast augmentation in 2023.  She can easily express drops.  Has been pumping 5 ml.  Baby has short anterior lingual frenulum.  Suggest discussing with Ped MD.  Resource sheet given.  Mother is breastfeeding and attempts to supplement with formula volume 5 ml. Nfant white nipple and extra slow flow nipple is what he has been using but it remains challenging for baby to consume more than 5 ml.   Baby continues to have difficulty with feeding.  He can latch on the breast but does not consistently sustain a deep latch.    Assisted with latching in cross cradle hold.  Baby initially latched with good depth and then slips down the nipple to shallow latch.  Baby did latch on both breasts for a total feeding time of 10 min.  Baby was supplemented with 8 ml of Enfamil using Nfant white standard nipple.  Request to return to baby's room since he was crying. Swaddled baby to calm.  Demonstrated how to feed in side lying hold, semi upright. Baby initially grasped nipple with strong pull.   Baby consumed 3 ml with Nfant white standard nipple and fell asleep.  Encouraged mother to post pump for 15 min.  Maternal Data Has patient been taught Hand Expression?: Yes  Feeding Mother's Current Feeding Choice: Breast Milk and Formula  LATCH Score Latch: Repeated attempts needed to sustain latch, nipple held in mouth throughout feeding, stimulation needed to elicit sucking reflex.  Audible Swallowing: A few with stimulation  Type of Nipple: Everted at rest and after stimulation  Comfort (Breast/Nipple): Soft / non-tender  Hold (Positioning): Assistance needed to correctly position  infant at breast and maintain latch.  LATCH Score: 7   Lactation Tools Discussed/Used    Interventions Interventions: Assisted with latch;Hand express;Support pillows;Education;DEBP  Discharge WIC Program: Yes  Consult Status Consult Status: Follow-up Date: 08/20/24 Follow-up type: In-patient    Shannon Dines [facility] 08/19/2024, 8:28 AM

## 2024-08-20 NOTE — Lactation Note (Signed)
 This note was copied from a baby's chart. Lactation Consultation Note  Patient Name: Megan Nolan Unijb'd Date: 08/20/2024 Age:37 hours Reason for consult: Follow-up assessment;1st time breastfeeding;Term;Infant < 6lbs  P2, Weight increased. Mother continues to latch baby and states his depth is improving. She is happy because she started using [employer] nipple (she plans to use slow flow nipple with it) bottle.   She states he is now able to consume 30-50 ml of 20 kcal of Enfamil formula.  Breastfeeding sessions have been 5-10 min.  Encouraged mother to continue to pump.  WIC referral has been sent.  Mother was given paperwork for [facility] loaner.  She will call when ready for pump.   Maternal Data Has patient been taught Hand Expression?: Yes  Feeding Mother's Current Feeding Choice: Breast Milk and Formula Nipple Type: Other  Interventions Interventions: Education;DEBP  Consult Status Consult Status: Follow-up Date: 08/20/24 Follow-up type: In-patient   Shannon Dines Boschen  RN, IBCLC 08/20/2024, 10:13 AM

## 2024-08-21 ENCOUNTER — Encounter: Payer: Self-pay | Admitting: Obstetrics & Gynecology

## 2024-08-22 ENCOUNTER — Inpatient Hospital Stay (HOSPITAL_COMMUNITY): Admission: RE | Admit: 2024-08-22 | Payer: Self-pay | Source: Home / Self Care | Admitting: Obstetrics & Gynecology

## 2024-08-22 ENCOUNTER — Inpatient Hospital Stay (HOSPITAL_COMMUNITY): Payer: Self-pay

## 2024-08-28 ENCOUNTER — Telehealth (HOSPITAL_COMMUNITY): Payer: Self-pay

## 2024-08-28 NOTE — Telephone Encounter (Signed)
 08/28/2024 1411  Name: Megan Nolan MRN: [id number] DOB: 05-21-1986  Reason for Call:  Transition of Care Hospital Discharge Call  Contact Status: Patient Contact Status: Complete  Language assistant needed:          Follow-Up Questions: Do You Have Any Concerns About Your Health As You Heal From Delivery?: No Do You Have Any Concerns About Your Infants Health?: No  Edinburgh Postnatal Depression Scale:  In the Past 7 Days: I have been able to laugh and see the funny side of things.: As much as I always could I have looked forward with enjoyment to things.: As much as I ever did I have blamed myself unnecessarily when things went wrong.: No, never I have been anxious or worried for no good reason.: No, not at all I have felt scared or panicky for no good reason.: No, not at all Things have been getting on top of me.: No, I have been coping as well as ever I have been so unhappy that I have had difficulty sleeping.: Not at all I have felt sad or miserable.: No, not at all I have been so unhappy that I have been crying.: No, never The thought of harming myself has occurred to me.: Never [place] Postnatal Depression Scale Total: 0  PHQ2-9 Depression Scale:     Discharge Follow-up: Edinburgh score requires follow up?: No Patient was advised of the following resources:: Breastfeeding Support Group, Support Group  Post-discharge interventions: Reviewed Newborn Safe Sleep Practices  Signature  Rosaline Deretha PEAK

## 2024-08-31 ENCOUNTER — Encounter: Payer: Self-pay | Admitting: Obstetrics & Gynecology

## 2024-10-01 ENCOUNTER — Ambulatory Visit: Payer: Self-pay | Admitting: Obstetrics & Gynecology

## 2024-12-04 ENCOUNTER — Ambulatory Visit: Payer: MEDICAID | Admitting: Obstetrics and Gynecology

## 2024-12-04 VITALS — BP 108/73 | HR 65 | Wt 151.0 lb

## 2024-12-04 DIAGNOSIS — Z3202 Encounter for pregnancy test, result negative: Secondary | ICD-10-CM | POA: Diagnosis not present

## 2024-12-04 DIAGNOSIS — Z3042 Encounter for surveillance of injectable contraceptive: Secondary | ICD-10-CM | POA: Diagnosis not present

## 2024-12-04 LAB — POCT URINE PREGNANCY: Preg Test, Ur: NEGATIVE

## 2024-12-04 MED ORDER — MEDROXYPROGESTERONE ACETATE 150 MG/ML IM SUSP
150.0000 mg | Freq: Once | INTRAMUSCULAR | Status: AC
  Administered 2024-12-04: 150 mg via INTRAMUSCULAR

## 2024-12-04 MED ORDER — MEDROXYPROGESTERONE ACETATE 150 MG/ML IM SUSP: 150.0000 mg | INTRAMUSCULAR | 3 refills | Status: AC

## 2024-12-04 NOTE — Progress Notes (Signed)
 Obstetrics and Gynecology Visit Return Patient Evaluation  Appointment Date: 12/04/2024  Primary Care Provider: Patient, No Pcp Per  OBGYN Clinic: [facility]  Chief Complaint: desire for repeat depo  History of Present Illness:  Megan Nolan is a 39 y.o. s/p 10/3 uncomplicated TSVD and she received depo PP and she desires to continue; she had use depo in the past  Pap neg 2024  Review of Systems: as noted in the History of Present Illness.  Patient Active Problem List   Diagnosis Date Noted   Displacement of lumbar intervertebral disc without myelopathy 08/17/2024   Medications:  Megan Nolan had no medications administered during this visit. Current Outpatient Medications  Medication Sig Dispense Refill   medroxyPROGESTERone  (DEPO-PROVERA ) 150 MG/ML injection Inject 1 mL (150 mg total) into the muscle every 3 (three) months. 1 mL 3   acetaminophen  (TYLENOL ) 500 MG tablet Take 2 tablets (1,000 mg total) by mouth every 6 (six) hours as needed (for pain scale < 4).     ibuprofen  (ADVIL ) 600 MG tablet Take 1 tablet (600 mg total) by mouth every 6 (six) hours. 30 tablet 0   Prenatal 28-0.8 MG TABS Take 1 tablet by mouth daily. 30 tablet 12   senna-docusate (SENOKOT-S) 8.6-50 MG tablet Take 2 tablets by mouth daily. 60 tablet 0   Current Facility-Administered Medications  Medication Dose Route Frequency Provider Last Rate Last Admin   medroxyPROGESTERone  (DEPO-PROVERA ) injection 150 mg  150 mg Intramuscular Once         Allergies: has no known allergies.  Physical Exam:  BP 108/73   Pulse 65   Wt 151 lb (68.5 kg)   Breastfeeding No   BMI 24.01 kg/m  Body mass index is 24.01 kg/m. General appearance: Well nourished, well developed female in no acute distress.   Neuro/Psych:  Normal mood and affect.    Assessment: patient doing well  Plan:  1. Encounter for management and injection of depo-Provera  (Primary) UPT negative. D/w  her re: depo and pt desires repeat injection today. D/w her re: vitamin d  supplementation and calcium. Pt amenable to vitamin d  level check today - POCT urine pregnancy - VITAMIN D  25 Hydroxy (Vit-D Deficiency, Fractures) Return in about 3 months (around 03/04/2025) for depo provera  nurse visit.   Megan Izell Raddle MD Attending Center for [employer] Midwife)

## 2024-12-05 LAB — VITAMIN D 25 HYDROXY (VIT D DEFICIENCY, FRACTURES): Vit D, 25-Hydroxy: 15.8 ng/mL — ABNORMAL LOW (ref 30.0–100.0)

## 2024-12-06 ENCOUNTER — Ambulatory Visit: Payer: Self-pay | Admitting: Obstetrics and Gynecology

## 2024-12-06 DIAGNOSIS — R7989 Other specified abnormal findings of blood chemistry: Secondary | ICD-10-CM

## 2024-12-06 MED ORDER — VITAMIN D (ERGOCALCIFEROL) 1.25 MG (50000 UNIT) PO CAPS: 50000.0000 [IU] | ORAL_CAPSULE | ORAL | 0 refills | Status: AC

## 2025-02-20 ENCOUNTER — Ambulatory Visit: Payer: MEDICAID
